# Patient Record
Sex: Male | Born: 1975 | Race: White | Hispanic: No | Marital: Married | Smoking: Never smoker
Health system: Southern US, Community
[De-identification: ages and names within clinical notes are randomized; demographics above are authoritative.]

## PROBLEM LIST (undated history)

## (undated) DIAGNOSIS — B019 Varicella without complication: Secondary | ICD-10-CM

## (undated) DIAGNOSIS — K219 Gastro-esophageal reflux disease without esophagitis: Secondary | ICD-10-CM

## (undated) DIAGNOSIS — T7840XA Allergy, unspecified, initial encounter: Secondary | ICD-10-CM

## (undated) DIAGNOSIS — E785 Hyperlipidemia, unspecified: Secondary | ICD-10-CM

## (undated) DIAGNOSIS — I10 Essential (primary) hypertension: Secondary | ICD-10-CM

## (undated) DIAGNOSIS — M109 Gout, unspecified: Secondary | ICD-10-CM

## (undated) HISTORY — DX: Varicella without complication: B01.9

## (undated) HISTORY — DX: Allergy, unspecified, initial encounter: T78.40XA

## (undated) HISTORY — DX: Hyperlipidemia, unspecified: E78.5

## (undated) HISTORY — DX: Gastro-esophageal reflux disease without esophagitis: K21.9

---

## 2012-01-02 ENCOUNTER — Emergency Department (HOSPITAL_BASED_OUTPATIENT_CLINIC_OR_DEPARTMENT_OTHER)
Admission: EM | Admit: 2012-01-02 | Discharge: 2012-01-02 | Disposition: A | Discharge status: Home / Self Care | Payer: 59 | Attending: Emergency Medicine | Admitting: Emergency Medicine

## 2012-01-02 ENCOUNTER — Emergency Department (HOSPITAL_BASED_OUTPATIENT_CLINIC_OR_DEPARTMENT_OTHER): Payer: 59

## 2012-01-02 ENCOUNTER — Encounter (HOSPITAL_BASED_OUTPATIENT_CLINIC_OR_DEPARTMENT_OTHER): Payer: Self-pay | Admitting: *Deleted

## 2012-01-02 DIAGNOSIS — R0789 Other chest pain: Secondary | ICD-10-CM

## 2012-01-02 DIAGNOSIS — I10 Essential (primary) hypertension: Secondary | ICD-10-CM | POA: Insufficient documentation

## 2012-01-02 DIAGNOSIS — R079 Chest pain, unspecified: Secondary | ICD-10-CM | POA: Insufficient documentation

## 2012-01-02 DIAGNOSIS — Z79899 Other long term (current) drug therapy: Secondary | ICD-10-CM | POA: Insufficient documentation

## 2012-01-02 DIAGNOSIS — R209 Unspecified disturbances of skin sensation: Secondary | ICD-10-CM | POA: Insufficient documentation

## 2012-01-02 HISTORY — DX: Essential (primary) hypertension: I10

## 2012-01-02 LAB — TROPONIN I
Troponin I: <0.3 ng/mL (ref <0.30)
Troponin I: <0.3 ng/mL (ref <0.30)

## 2012-01-02 NOTE — ED Provider Notes (Addendum)
History     CSN: [id number]  Arrival date & time 01/02/12  1308   First MD Initiated Contact with Patient 01/02/12 0800      Chief Complaint  Patient presents with  . Chest Pain    (Consider location/radiation/quality/duration/timing/severity/associated sxs/prior treatment) HPI Comments: Patient reports at 7:30 this morning he is walking outside to his car when he developed some tingling in a numb sensation involving the left side of his chest, left shoulder and into his left armpit and down the upper portion of his left arm. He reports his left arm continues to feel "heavy" but is not weak. He denies any numbness or weakness elsewhere in his body, no speech difficulty no headache. He denies any prior stroke or heart issues. He does take lisinopril for blood pressure. He notes that his grandfather had bypass cardiac surgery but no heart condition in his immediate family members. Patient does not smoke. He reports that [place] and a half ago without any exertional shortness of breath or chest pain. He reports with today's episode, which lasted approximately 5 minutes with nearly complete resolution, he did not have any shortness of breath, sweats, nausea or vomiting. In addition, he denies any fever, chills, URI symptoms. He denies any long distance travel, lower leg extremity swelling or pain and no pleuritic pain. He had not yet eaten anything significant this morning.  Patient is a 36 y.o. male presenting with chest pain. The history is provided by the patient.  Chest Pain Pertinent negatives for primary symptoms include no fever, no shortness of breath, no cough, no abdominal pain, no nausea and no vomiting.     Past Medical History  Diagnosis Date  . Hypertension     History reviewed. No pertinent past surgical history.  History reviewed. No pertinent family history.  History  Substance Use Topics  . Smoking status: Never Smoker   .  Smokeless tobacco: Not on file  . Alcohol Use: Yes      Review of Systems  Constitutional: Negative for fever and chills.  HENT: Negative for congestion and sore throat.   Respiratory: Negative for cough and shortness of breath.   Cardiovascular: Positive for chest pain.  Gastrointestinal: Negative for nausea, vomiting, abdominal pain and diarrhea.  Musculoskeletal: Negative for back pain.  All other systems reviewed and are negative.    Allergies  Review of patient's allergies indicates no known allergies.  Home Medications   Current Outpatient Rx  Name Route Sig Dispense Refill  . OMEGA-3 FATTY ACIDS 1000 MG PO CAPS Oral Take 2 g by mouth daily.    Marland Kitchen LISINOPRIL-HYDROCHLOROTHIAZIDE PO Oral Take by mouth.    Marland Kitchen VITAMIN D MAINTENANCE PO Oral Take by mouth.      BP 150/85  Pulse 68  Temp 97.8 F (36.6 C) (Oral)  Resp 18  SpO2 97%  Physical Exam  Nursing note and vitals reviewed. Constitutional: He is oriented to person, place, and time. He appears well-developed and well-nourished.  HENT:  Head: Normocephalic and atraumatic.  Eyes: Pupils are equal, round, and reactive to light.  Neck: Normal range of motion.  Cardiovascular: Normal rate and regular rhythm.   Pulmonary/Chest: Effort normal and breath sounds normal. No respiratory distress.  Abdominal: Soft. He exhibits no distension. There is no tenderness.  Musculoskeletal: Normal range of motion. He exhibits no edema and no tenderness.  Neurological: He is alert and oriented to person, place, and time. He has  normal strength. No cranial nerve deficit or sensory deficit. GCS eye subscore is 4. GCS verbal subscore is 5. GCS motor subscore is 6.  Skin: Skin is warm and dry.    ED Course  Procedures (including critical care time)   Labs Reviewed  TROPONIN I  TROPONIN I   Dg Chest 2 View  01/02/2012  *RADIOLOGY REPORT*  Clinical Data: Left arm numbness and tingling.  CHEST - 2 VIEW  Comparison: None.  Findings:  Normal sized heart.  Clear lungs with normal vascularity. Mild central peribronchial thickening.  Unremarkable bones.  IMPRESSION: Mild chronic bronchitic changes.  No acute abnormality.  Original Report Authenticated By: Darrol Angel, M.D.   I reviewed above film myself and I interpret to be normal.  1. Chest pain, atypical     RA sat is normal by my interpretation ECG at time 0806 shows NSR< normal ECG, no ST or T wave abn's.     11:09 AM Feels comfortable, no recurrence of severe pain.  No SOB, BP is improved.  Serial troponins are normal.  ECG normal, pt safe to follow up with PCP.  MDM  Pt with atypical CP, nearly resolved, now, no ECG changes.  Pt is reassured.  Will get delta troponin by lab.  Pt can follow up with PCP at [facility] in [facility] this week.          Gavin Pound. Oletta Lamas, MD 01/02/12 1109  Gavin Pound. Oletta Lamas, MD 01/02/12 1109  Gavin Pound. Marenda Accardi, MD 01/02/12 1119

## 2012-01-02 NOTE — ED Notes (Addendum)
Patient states around 730 this morning he experienced numbness/tingling on L side of chest when walking that went down his L arm, lasted around 5 minutes, L arm still feels weak, no SOB, no pain at this time

## 2012-01-02 NOTE — Discharge Instructions (Signed)
 Chest Pain (Nonspecific) It is often hard to give a specific diagnosis for the cause of chest pain. There is always a chance that your pain could be related to something serious, such as a heart attack or a blood clot in the lungs. You need to follow up with your caregiver for further evaluation. CAUSES   Heartburn.   Pneumonia or bronchitis.   Anxiety or stress.   Inflammation around your heart (pericarditis) or lung (pleuritis or pleurisy).   A blood clot in the lung.   A collapsed lung (pneumothorax). It can develop suddenly on its own (spontaneous pneumothorax) or from injury (trauma) to the chest.   Shingles infection (herpes zoster virus).  The chest wall is composed of bones, muscles, and cartilage. Any of these can be the source of the pain.  The bones can be bruised by injury.   The muscles or cartilage can be strained by coughing or overwork.   The cartilage can be affected by inflammation and become sore (costochondritis).  DIAGNOSIS  Lab tests or other studies, such as X-rays, electrocardiography, stress testing, or cardiac imaging, may be needed to find the cause of your pain.  TREATMENT   Treatment depends on what may be causing your chest pain. Treatment may include:   Acid blockers for heartburn.   Anti-inflammatory medicine.   Pain medicine for inflammatory conditions.   Antibiotics if an infection is present.   You may be advised to change lifestyle habits. This includes stopping smoking and avoiding alcohol, caffeine, and chocolate.   You may be advised to keep your head raised (elevated) when sleeping. This reduces the chance of acid going backward from your stomach into your esophagus.   Most of the time, nonspecific chest pain will improve within 2 to 3 days with rest and mild pain medicine.  HOME CARE INSTRUCTIONS   If antibiotics were prescribed, take your antibiotics as directed. Finish them even if you start to feel better.   For the next few  days, avoid physical activities that bring on chest pain. Continue physical activities as directed.   Do not smoke.   Avoid drinking alcohol.   Only take over-the-counter or prescription medicine for pain, discomfort, or fever as directed by your caregiver.   Follow your caregiver's suggestions for further testing if your chest pain does not go away.   Keep any follow-up appointments you made. If you do not go to an appointment, you could develop lasting (chronic) problems with pain. If there is any problem keeping an appointment, you must call to reschedule.  SEEK MEDICAL CARE IF:   You think you are having problems from the medicine you are taking. Read your medicine instructions carefully.   Your chest pain does not go away, even after treatment.   You develop a rash with blisters on your chest.  SEEK IMMEDIATE MEDICAL CARE IF:   You have increased chest pain or pain that spreads to your arm, neck, jaw, back, or abdomen.   You develop shortness of breath, an increasing cough, or you are coughing up blood.   You have severe back or abdominal pain, feel nauseous, or vomit.   You develop severe weakness, fainting, or chills.   You have a fever.  THIS IS AN EMERGENCY. Do not wait to see if the pain will go away. Get medical help at once. Call your local emergency services (911 in [place] Do not drive yourself to the hospital. MAKE SURE YOU:   Understand these instructions.  Will watch your condition.   Will get help right away if you are not doing well or get worse.  Document Released: 03/22/2005 Document Revised: 06/01/2011 Document Reviewed: 01/16/2008 [facility] Patient Information 2012 [place], [place].

## 2013-01-15 ENCOUNTER — Encounter (HOSPITAL_COMMUNITY): Payer: Self-pay | Admitting: *Deleted

## 2013-01-15 ENCOUNTER — Emergency Department (HOSPITAL_COMMUNITY)
Admission: EM | Admit: 2013-01-15 | Discharge: 2013-01-15 | Disposition: A | Discharge status: Home / Self Care | Payer: 59 | Attending: Emergency Medicine | Admitting: Emergency Medicine

## 2013-01-15 DIAGNOSIS — R51 Headache: Secondary | ICD-10-CM | POA: Insufficient documentation

## 2013-01-15 DIAGNOSIS — R110 Nausea: Secondary | ICD-10-CM | POA: Insufficient documentation

## 2013-01-15 DIAGNOSIS — H538 Other visual disturbances: Secondary | ICD-10-CM | POA: Insufficient documentation

## 2013-01-15 DIAGNOSIS — I10 Essential (primary) hypertension: Secondary | ICD-10-CM | POA: Insufficient documentation

## 2013-01-15 DIAGNOSIS — R42 Dizziness and giddiness: Secondary | ICD-10-CM

## 2013-01-15 DIAGNOSIS — Z79899 Other long term (current) drug therapy: Secondary | ICD-10-CM | POA: Insufficient documentation

## 2013-01-15 MED ORDER — MECLIZINE HCL 25 MG PO TABS
25.0000 mg | ORAL_TABLET | Freq: Once | ORAL | Status: AC
  Administered 2013-01-15: 25 mg via ORAL
  Filled 2013-01-15: qty 1

## 2013-01-15 MED ORDER — DIAZEPAM 5 MG PO TABS: 5.0000 mg | ORAL_TABLET | Freq: Three times a day (TID) | ORAL | Status: DC | PRN

## 2013-01-15 MED ORDER — MECLIZINE HCL 50 MG PO TABS: 50.0000 mg | ORAL_TABLET | Freq: Two times a day (BID) | ORAL | Status: DC | PRN

## 2013-01-15 MED ORDER — SODIUM CHLORIDE 0.9 % IV BOLUS (SEPSIS)
1000.0000 mL | Freq: Once | INTRAVENOUS | Status: AC
  Administered 2013-01-15: 1000 mL via INTRAVENOUS

## 2013-01-15 NOTE — ED Provider Notes (Signed)
History    CSN: [id number] Arrival date & time 01/15/13  1111  First MD Initiated Contact with Patient 01/15/13 1228     Chief Complaint  Patient presents with  . Dizziness   (Consider location/radiation/quality/duration/timing/severity/associated sxs/prior Treatment) HPI Pt is a 37yo male presenting today with dizziness that started earlier this morning.  Pt reports recent sinus pressure over the last week and notice an episode of dizziness last week that lasted a few seconds.  Pt also reports today was the first day he started his lower dose of BP medication.  He use to take Lisinopril with HCTZ, but today he started just Lisinopril as prescribed by provided at the [place].  Today pt states he was sitting down for an hour at work, when he stood up to write on a board he was doing okay until the room suddenly started to spin.   Pt states he feels off balance and cannot focus on one object, as if his eyes are looking in different directions.  When he closes his eyes his symptoms resolve.  Also reports mild nausea when his eyes are open.  Denies headache, numbness, tingling, vomiting.  Denies fevers, chills, cough, chest pain or SOB.     Past Medical History  Diagnosis Date  . Hypertension    History reviewed. No pertinent past surgical history. No family history on file. History  Substance Use Topics  . Smoking status: Never Smoker   . Smokeless tobacco: Not on file  . Alcohol Use: Yes    Review of Systems  Constitutional: Negative for fever, chills and fatigue.  Eyes: Positive for visual disturbance. Negative for photophobia, pain and redness.  Respiratory: Negative for shortness of breath.   Cardiovascular: Negative for chest pain.  Gastrointestinal: Positive for nausea. Negative for vomiting and diarrhea.  Neurological: Positive for dizziness ( "room spinning") and headaches ( mild). Negative for facial asymmetry, speech difficulty, weakness, light-headedness and numbness.  All  other systems reviewed and are negative.    Allergies  Review of patient's allergies indicates no known allergies.  Home Medications   Current Outpatient Rx  Name  Route  Sig  Dispense  Refill  . lisinopril (PRINIVIL,ZESTRIL) 10 MG tablet   Oral   Take 5 mg by mouth daily.         . diazepam (VALIUM) 5 MG tablet   Oral   Take 1 tablet (5 mg total) by mouth every 8 (eight) hours as needed.   6 tablet   0   . meclizine (ANTIVERT) 50 MG tablet   Oral   Take 1 tablet (50 mg total) by mouth 2 (two) times daily as needed for dizziness or nausea.   30 tablet   0    BP 149/90  Pulse 63  Temp(Src) 97.9 F (36.6 C) (Oral)  Resp 20  SpO2 97% Physical Exam  Nursing note and vitals reviewed. Constitutional: He is oriented to person, place, and time. He appears well-developed and well-nourished.  Pt sitting in exam bed, eyes closed.   HENT:  Head: Normocephalic and atraumatic.  Right Ear: Hearing, external ear and ear canal normal. Tympanic membrane is bulging. Tympanic membrane is not erythematous.  Left Ear: Hearing, external ear and ear canal normal. Tympanic membrane is bulging. Tympanic membrane is not erythematous.  Nose: Nose normal.  Mouth/Throat: Uvula is midline, oropharynx is clear and moist and mucous membranes are normal. No oropharyngeal exudate, posterior oropharyngeal edema, posterior oropharyngeal erythema or tonsillar abscesses.  Eyes: Conjunctivae and  EOM are normal. Pupils are equal, round, and reactive to light. Right eye exhibits no discharge. Left eye exhibits no discharge. No scleral icterus.  Neck: Normal range of motion. Neck supple.  Cardiovascular: Normal rate, regular rhythm and normal heart sounds.   Pulmonary/Chest: Effort normal and breath sounds normal. No respiratory distress. He has no wheezes. He has no rales. He exhibits no tenderness.  Abdominal: Soft. Bowel sounds are normal. He exhibits no distension and no mass. There is no tenderness.  There is no rebound and no guarding.  Musculoskeletal: Normal range of motion.  Neurological: He is alert and oriented to person, place, and time. He has normal strength. No cranial nerve deficit or sensory deficit. Coordination normal. GCS eye subscore is 4. GCS verbal subscore is 5. GCS motor subscore is 6.  CN II-XII in tact, no focal deficit, nl finger to nose coordination. Nl sensation, 5/5 strength in all major muscle groups. Neg romberg and nl gait.    Skin: Skin is warm and dry.    ED Course  Procedures (including critical care time) Labs Reviewed - No data to display No results found. 1. Vertigo     MDM  Pt presenting with classic vertigo.  Will tx with meclizine and fluids.  If not improved, will try ativan.  6:09 AM Pt states that he has moderate relief from fluids and meclizine. Would like to go home and have prescription medication for symptoms.  [facility] he can [facility] if needed.    Rx: antivert and valium.  Will discharge pt home and [facility] for [facility] as well as ongoing blood pressure monitoring and management. Return precautions given. Pt verbalized understanding and agreement with tx plan. Vitals: unremarkable. Discharged in stable condition.    Discussed pt with attending during ED encounter.   Junius Finner, PA-C 01/17/13 [phone]

## 2013-01-15 NOTE — ED Notes (Signed)
PT endorses dizziness starting today. intermittent "room spinning". Each time Sx return they are worse. PT reports upper sinus congestion recently. No known fever.

## 2013-01-15 NOTE — ED Notes (Signed)
Pt has had recent sinus pressure.  PT states today was sitting down for one hour and stood up to write on board and was doing fine until all the sudden the room started spinning, his balance was off, and states he had to shut his eyes.  Pt states at one point he could not even focus but that is better now.  Pt denies pain.  No extremity deficits.  Pt appears to have nystagmus when moving his eyes to the left and right.

## 2013-01-19 NOTE — ED Provider Notes (Signed)
Medical screening examination/treatment/procedure(s) were performed by non-physician practitioner and as supervising physician I was immediately available for consultation/collaboration.  Xaria Judon, MD 01/19/13 1016 

## 2013-06-26 HISTORY — PX: WISDOM TOOTH EXTRACTION: SHX21

## 2017-01-21 ENCOUNTER — Encounter (HOSPITAL_BASED_OUTPATIENT_CLINIC_OR_DEPARTMENT_OTHER): Payer: Self-pay | Admitting: *Deleted

## 2017-01-21 ENCOUNTER — Emergency Department (HOSPITAL_BASED_OUTPATIENT_CLINIC_OR_DEPARTMENT_OTHER)
Admission: EM | Admit: 2017-01-21 | Discharge: 2017-01-21 | Disposition: A | Discharge status: Home / Self Care | Payer: Non-veteran care | Attending: Physician Assistant | Admitting: Physician Assistant

## 2017-01-21 DIAGNOSIS — R51 Headache: Secondary | ICD-10-CM | POA: Diagnosis not present

## 2017-01-21 DIAGNOSIS — R6884 Jaw pain: Secondary | ICD-10-CM | POA: Insufficient documentation

## 2017-01-21 DIAGNOSIS — Z79899 Other long term (current) drug therapy: Secondary | ICD-10-CM | POA: Insufficient documentation

## 2017-01-21 DIAGNOSIS — I10 Essential (primary) hypertension: Secondary | ICD-10-CM | POA: Insufficient documentation

## 2017-01-21 DIAGNOSIS — R519 Headache, unspecified: Secondary | ICD-10-CM

## 2017-01-21 DIAGNOSIS — R220 Localized swelling, mass and lump, head: Secondary | ICD-10-CM | POA: Diagnosis present

## 2017-01-21 HISTORY — DX: Gout, unspecified: M10.9

## 2017-01-21 MED ORDER — CHLORHEXIDINE GLUCONATE 0.12 % MT SOLN: 15.0000 mL | Freq: Two times a day (BID) | OROMUCOSAL | 0 refills | Status: DC

## 2017-01-21 NOTE — ED Notes (Signed)
MD with pt  

## 2017-01-21 NOTE — ED Triage Notes (Signed)
Pt c/o of left ear pain since Wednesday. States on Thursday he had increased pain to the left side of his face/jaw/left ear. Was seen by his Dentist , had xrays and they did not think it was dental. Was prescribed Amoxicillin. States pain was worse later on Thursday so he was seen at urgent care and was given prednisone but did not fill. Has been taking Garlic pills since pain started. Has also taken aleve without relief. Came to be seen today because of concern of white patch to left inner cheek area and swelling to his left jaw area. On exam swelling noted to his left jaw. Denies any fevers.

## 2017-01-21 NOTE — ED Provider Notes (Signed)
[facility] Provider Note   CSN: [id number] Arrival date & time: 01/21/17  0240     History   Chief Complaint Chief Complaint  Patient presents with  . facial swelling    HPI Brian Rhodes is a 41 y.o. male.  HPI   Patient a 41 year old male presenting with swelling to the left jaw. Patient has had intermittent sharp pains to the left upper jaw Thursday. He went to his dentist had a Panorex which was normal. He then has been using garlic cloves crushed on the side of his mouth all day every day since then. Patient does not take antibiotics at the dentist prescribed. Patient increased pain on Thursday and went to a [place] and [place]. Was prescribed steroids. He also did not take these. He's coming now because he has a small amount of swelling to the left jaw.  Past Medical History:  Diagnosis Date  . Gout   . Hypertension     There are no active problems to display for this patient.   History reviewed. No pertinent surgical history.     Home Medications    Prior to Admission medications   Medication Sig Start Date End Date Taking? Authorizing Provider  ALLOPURINOL PO Take by mouth.   Yes [provider]  hydrALAZINE (APRESOLINE) 25 MG tablet Take 25 mg by mouth 2 (two) times daily.   Yes [provider]  chlorhexidine (PERIDEX) 0.12 % solution Use as directed 15 mLs in the mouth or throat 2 (two) times daily. 01/21/17   Mackuen, Courteney Lyn, MD  diazepam (VALIUM) 5 MG tablet Take 1 tablet (5 mg total) by mouth every 8 (eight) hours as needed. 01/15/13   Lurene ShadowPhelps, Erin O, PA-C  lisinopril (PRINIVIL,ZESTRIL) 10 MG tablet Take 5 mg by mouth daily.    [provider]  meclizine (ANTIVERT) 50 MG tablet Take 1 tablet (50 mg total) by mouth 2 (two) times daily as needed for dizziness or nausea. 01/15/13   Lurene ShadowPhelps, Erin O, PA-C    Family History No family history on file.  Social History Social History  Substance Use Topics  . Smoking  status: Never Smoker  . Smokeless tobacco: Never Used  . Alcohol use No     Allergies   Patient has no known allergies.   Review of Systems Review of Systems  Constitutional: Negative for fatigue and fever.  HENT: Positive for mouth sores.      Physical Exam Updated Vital Signs BP (!) 163/117 (BP Location: Right Arm)   Pulse 76   Temp 98.6 F (37 C) (Oral)   Ht 5\' 11"  (1.803 m)   Wt 116.6 kg (257 lb)   SpO2 98%   BMI 35.84 kg/m   Physical Exam  Constitutional: He is oriented to person, place, and time. He appears well-nourished.  HENT:  Head: Normocephalic.  White areas to the inside cheek and lower jaw. Looks reactive. It is in the same place that he's been stuffing the garlic. Pea-sized swelling on the lower mandible. Likely lymph node.  Eyes: Conjunctivae are normal.  Cardiovascular: Normal rate.   Pulmonary/Chest: Effort normal.  Neurological: He is oriented to person, place, and time.  Skin: Skin is warm and dry. He is not diaphoretic.  Psychiatric: He has a normal mood and affect. His behavior is normal.     ED Treatments / Results  Labs (all labs ordered are listed, but only abnormal results are displayed) Labs Reviewed - No data to display  EKG  EKG Interpretation None       Radiology No results found.  Procedures Procedures (including critical care time)  Medications Ordered in ED Medications - No data to display   Initial Impression / Assessment and Plan / ED Course  I have reviewed the triage vital signs and the nursing notes.  Pertinent labs & imaging results that were available during my care of the patient were reviewed by me and considered in my medical decision making (see chart for details).     Patient has been using garlic to treat occasional pains in his mouth. He has already ruled out of dental infection at the dentist but was prescribed antibiotic. He has been using garlic which I think is cause a local inflammation in his  mouth. We will have him stop using the garlic, use Peridex. We encouraged him to use antibiotics as prescribed and to follow up with his primary this week.  Final Clinical Impressions(s) / ED Diagnoses   Final diagnoses:  Pain of cheek    New Prescriptions New Prescriptions   CHLORHEXIDINE (PERIDEX) 0.12 % SOLUTION    Use as directed 15 mLs in the mouth or throat 2 (two) times daily.     Abelino DerrickMackuen, Courteney Lyn, MD 01/21/17 [phone]

## 2017-01-21 NOTE — Discharge Instructions (Signed)
We think you're likely having local inflammation due to the garlic that you've been putting in your cheek. Please use the mouthwash swish and spit. Used twice daily for 2 days. In addition to taking the antibiotics that you already have home. Please follow up wth a primary care.

## 2018-03-04 ENCOUNTER — Telehealth: Payer: Self-pay

## 2018-03-04 NOTE — Telephone Encounter (Signed)
Please advise 

## 2018-03-04 NOTE — Telephone Encounter (Signed)
Sure, I will see him, we can schedule

## 2018-03-04 NOTE — Telephone Encounter (Signed)
Called pt and scheduled appt for 07/18/18

## 2018-03-04 NOTE — Telephone Encounter (Signed)
Copied from CRM [id number]. Topic: Appointment Scheduling - Scheduling Inquiry for Clinic >> Mar 04, 2018  9:49 AM Jens Som A wrote: Reason for CRM: Pt called in and stated that he was recommend by a friend to see [facility] or [facility].  He would like to know if one of them would be willing to take him on has a new pt?    Best number  [phone]

## 2018-07-15 NOTE — Progress Notes (Addendum)
[facility] [address], [address] [place], [place] [phone] [phone]  Date:  07/18/2018   Name:  Brian JourneyDaniel Rhodes   DOB:  [birth year]   MRN:  [id number]  PCP:  Brian Rhodes    Chief Complaint: New Patient (Initial Visit)   History of Present Illness:  Brian JourneyDaniel Rhodes is a 43 y.o. very pleasant male patient who presents with the following:  New patient here today to establish care.  He is also a patient of the [facility] in [place], has been with the [facility] clinic for about 9 years History of hypertension and gout. He takes about 25 mg of allopurinol daily and this is sufficient to control his gout He also takes BOTH losartan and lisinopril-this was prescribed for him by the [facility] However we note that his blood pressure is still running high Patient recounts a history of possible depression symptoms when his HCTZ was increased from 12.5to 25 mg in the past.  However this is not entirely clear, could have been due to a different medication or circumstance Last labs in October per the [facility], he will try to get me a copy of these  He is married, works in Clinical biochemistcustomer service  Kids are 17,14 and 711 yo Oldest is early college/combination high school program.  He enjoys spending time with family and is active in his church He is part of the music program, plays the piano and keyboard  Both of his parents are Brian Rhodes They suffer from HTN, DM, hyperlipidemia No prostate cancer in the family No colon cancer in the family  His grandfather had CABG and we think his dad did too -both of these men had heart disease at a relatively early age  He is a non smoker, no tobacco Very rare alcohol For exercise he is working with a Psychologist, educationaltrainer; doing body weight exercises He is working on weight loss  He does not have any chest pain or shortness of breath, denies any chest pain with exercise, or decrease in exercise capacity  He notes a lump at the  base of his left index finger for a few years.  It only bothers him with direct pressure on the area.   He has noted some nervousness while driving recently- only on [address], smaller roads are ok He is still getting out on the highway,but does not enjoy it No accident occurred.  He does have PTSD which the the [facility] has treated.  He did some therapy that did help  He thinks it is driving issues are related to his PTSD, also wonders if his vision may be going downhill  He has not had an eye exam recently   He does have a [facility]ur [facility]-5 years [facility].  He is not sure what the outcome was   Current medications are lisinopril 10/hctz 12.5 and losartan 100  Amlodipine has caused chest pain in the past  He is very hesitant to take medications which "may cause side effects"  There are no active problems to display for this patient.   Past Medical History:  Diagnosis Date  . Allergy   . Chicken pox   . GERD (gastroesophageal reflux disease)   . Gout   . Hyperlipidemia   . Hypertension     Past Surgical History:  Procedure Laterality Date  . WISDOM TOOTH EXTRACTION  2015    Social History  Tobacco Use  . Smoking status: Never Smoker  . Smokeless tobacco: Former NeurosurgeonUser    Types: Chew  Substance Use Topics  . Alcohol use: Yes    Comment: occasionally  . Drug use: No    Family History  Problem Relation Age of Onset  . Diabetes Mother   . Hyperlipidemia Mother   . Hypertension Mother   . Miscarriages / [place] Mother   . Arthritis Father   . Diabetes Father   . Hyperlipidemia Father   . Hypertension Father   . Hypertension Sister   . Hypertension Brother   . Hypertension Brother   . Hypertension Brother     No Known Allergies  Medication list has been reviewed and updated.  Current Outpatient Medications on File Prior to Visit  Medication Sig Dispense Refill  . allopurinol (ZYLOPRIM) 100 MG tablet Take 100 mg by mouth daily. Taking  quarter of a tablet    . lisinopril-hydrochlorothiazide (PRINZIDE,ZESTORETIC) 10-12.5 MG tablet Take 1 tablet by mouth daily.    Marland Kitchen. losartan (COZAAR) 100 MG tablet Take 100 mg by mouth daily.     No current facility-administered medications on file prior to visit.     Review of Systems:  As per HPI- otherwise negative. His average home BP is 145/90 He is not sure about his tetanus shot- he thinks through the [facility], he thinks it is up-to-date He declines a flu shot today    Physical Examination: Vitals:   07/18/18 0942  BP: (!) 154/98  Pulse: 80  Resp: 16  Temp: 98.5 F (36.9 C)  SpO2: 97%   Vitals:   07/18/18 0942  Weight: 278 lb (126.1 kg)  Height: 5\' 11"  (1.803 m)   Body mass index is 38.77 kg/m. Ideal Body Weight: Weight in (lb) to have BMI = 25: 178.9  GEN: WDWN, NAD, Non-toxic, A & O x 3, obese, looks well  HEENT: Atraumatic, Normocephalic. Neck supple. No masses, No LAD. Bilateral TM wnl, oropharynx normal.  PEERL,EOMI.   Ears and Nose: No external deformity. CV: RRR, No G/R. No JVD. No thrill. No extra heart sounds. He has a loud, harsh systolic murmur PULM: CTA B, no wheezes, crackles, rhonchi. No retractions. No resp. distress. No accessory muscle use. ABD: S, NT, ND, +BS. No rebound. No HSM. EXTR: No c/c/e NEURO Normal gait.  PSYCH: Normally interactive. Conversant. Not depressed or anxious appearing.  Calm demeanor.  Likely cyst at the palmar aspect of the left second MCP joint.  This is the size of a large BB, it is mobile and smooth  EKG shows sinus rhythm, but possible old anterior infarct and nonspecific T wave abnormality.  I do not have any old tracing for comparison  Addnd; I did locate an EKG from 2014.  downgoing T waves in I are old, new downgoing T in V3.  Q in V2/3 are old Assessment and Plan: Physical exam  Heart murmur - Plan: EKG 12-Lead, ECHOCARDIOGRAM COMPLETE  Essential hypertension - Plan: Ambulatory referral to Cardiology, DISCONTINUED:  lisinopril (ZESTRIL) 10 MG tablet  Family history of early CAD - Plan: Ambulatory referral to Cardiology  Abnormal EKG - Plan: ECHOCARDIOGRAM COMPLETE, Ambulatory referral to Cardiology  Cyst of joint of left hand - Plan: Ambulatory referral to Hand Surgery   Here today as a new patient for physical exam.  He has had a recent routine labs per the [facility], and will get a copy to me. He has noted a cyst on his left hand, referral to  hand surgery for possible excision  He has a family history of heart disease, and we noted an abnormal EKG today.  He also has hypertension. In addition, he has a significant heart murmur.  This is not new, but has not been evaluated in several years. He is taking lisinopril 10/HCTZ 12.5, and also losartan 100.  Asked him to try doubling his lisinopril /HCTZ and stop losartan.  He will let me know how his BP looks on this regimen, will watch for any adverse side effects   Referral for an echocardiogram, and also refer to cardiology for further evaluation.  He may need a stress test of some type  Patient will keep me closely apprised as to his blood pressures Signed Abbe Amsterdam, Rhodes

## 2018-07-18 ENCOUNTER — Ambulatory Visit (INDEPENDENT_AMBULATORY_CARE_PROVIDER_SITE_OTHER): Payer: Managed Care, Other (non HMO) | Admitting: Family Medicine

## 2018-07-18 ENCOUNTER — Encounter: Payer: Self-pay | Admitting: Family Medicine

## 2018-07-18 VITALS — BP 154/98 | HR 80 | Temp 98.5°F | Resp 16 | Ht 71.0 in | Wt 278.0 lb

## 2018-07-18 DIAGNOSIS — Z0000 Encounter for general adult medical examination without abnormal findings: Secondary | ICD-10-CM | POA: Diagnosis not present

## 2018-07-18 DIAGNOSIS — R011 Cardiac murmur, unspecified: Secondary | ICD-10-CM

## 2018-07-18 DIAGNOSIS — Z8249 Family history of ischemic heart disease and other diseases of the circulatory system: Secondary | ICD-10-CM | POA: Diagnosis not present

## 2018-07-18 DIAGNOSIS — M25842 Other specified joint disorders, left hand: Secondary | ICD-10-CM

## 2018-07-18 DIAGNOSIS — I10 Essential (primary) hypertension: Secondary | ICD-10-CM

## 2018-07-18 DIAGNOSIS — R9431 Abnormal electrocardiogram [ECG] [EKG]: Secondary | ICD-10-CM

## 2018-07-18 MED ORDER — LISINOPRIL 10 MG PO TABS: 10.0000 mg | ORAL_TABLET | Freq: Every day | ORAL | 3 refills | Status: DC

## 2018-07-18 NOTE — Patient Instructions (Addendum)
Please bring me a copy of your most recent labs- CBC, metabolic profile, cholesterol I would also like to get the date of your most recent tetanus shot if you can find it   Your BP is a bit too high - let's add on 10 mg of lisinopril to your regimen Please let me know how your BP responds to this addition Let me know if you would like a referral to hand surgery to look at the cyst on your finger We will set you up for additional cardiac testing- an echo to evaluate your murmur, and a stress to look at your coronaries  You might try encourage you to consider taking a defensive driving course- this might increase your confidence behind the wheel.   Also, please do get you eyes checked!    Health Maintenance, Male A healthy lifestyle and preventive care is important for your health and wellness. Ask your health care provider about what schedule of regular examinations is right for you. What should I know about weight and diet? Eat a Healthy Diet  Eat plenty of vegetables, fruits, whole grains, low-fat dairy products, and lean protein.  Do not eat a lot of foods high in solid fats, added sugars, or salt.  Maintain a Healthy Weight Regular exercise can help you achieve or maintain a healthy weight. You should:  Do at least 150 minutes of exercise each week. The exercise should increase your heart rate and make you sweat (moderate-intensity exercise).  Do strength-training exercises at least twice a week. Watch Your Levels of Cholesterol and Blood Lipids  Have your blood tested for lipids and cholesterol every 5 years starting at 44 years of age. If you are at high risk for heart disease, you should start having your blood tested when you are 44 years old. You may need to have your cholesterol levels checked more often if: ? Your lipid or cholesterol levels are high. ? You are older than 44 years of age. ? You are at high risk for heart disease. What should I know about cancer  screening? Many types of cancers can be detected early and may often be prevented. Lung Cancer  You should be screened every year for lung cancer if: ? You are a current smoker who has smoked for at least 30 years. ? You are a former smoker who has quit within the past 15 years.  Talk to your health care provider about your screening options, when you should start screening, and how often you should be screened. Colorectal Cancer  Routine colorectal cancer screening usually begins at 44 years of age and should be repeated every 5-10 years until you are 44 years old. You may need to be screened more often if early forms of precancerous polyps or small growths are found. Your health care provider may recommend screening at an earlier age if you have risk factors for colon cancer.  Your health care provider may recommend using home test kits to check for hidden blood in the stool.  A small camera at the end of a tube can be used to examine your colon (sigmoidoscopy or colonoscopy). This checks for the earliest forms of colorectal cancer. Prostate and Testicular Cancer  Depending on your age and overall health, your health care provider may do certain tests to screen for prostate and testicular cancer.  Talk to your health care provider about any symptoms or concerns you have about testicular or prostate cancer. Skin Cancer  Check your skin from  head to toe regularly.  Tell your health care provider about any new moles or changes in moles, especially if: ? There is a change in a mole's size, shape, or color. ? You have a mole that is larger than a pencil eraser.  Always use sunscreen. Apply sunscreen liberally and repeat throughout the day.  Protect yourself by wearing long sleeves, pants, a wide-brimmed hat, and sunglasses when outside. What should I know about heart disease, diabetes, and high blood pressure?  If you are 101-74 years of age, have your blood pressure checked every 3-5  years. If you are 60 years of age or older, have your blood pressure checked every year. You should have your blood pressure measured twice-once when you are at a hospital or clinic, and once when you are not at a hospital or clinic. Record the average of the two measurements. To check your blood pressure when you are not at a hospital or clinic, you can use: ? An automated blood pressure machine at a pharmacy. ? A home blood pressure monitor.  Talk to your health care provider about your target blood pressure.  If you are between 39-51 years old, ask your health care provider if you should take aspirin to prevent heart disease.  Have regular diabetes screenings by checking your fasting blood sugar level. ? If you are at a normal weight and have a low risk for diabetes, have this test once every three years after the age of 82. ? If you are overweight and have a high risk for diabetes, consider being tested at a younger age or more often.  A one-time screening for abdominal aortic aneurysm (AAA) by ultrasound is recommended for men aged 65-75 years who are current or former smokers. What should I know about preventing infection? Hepatitis B If you have a higher risk for hepatitis B, you should be screened for this virus. Talk with your health care provider to find out if you are at risk for hepatitis B infection. Hepatitis C Blood testing is recommended for:  Everyone born from [birth year] through 1966.  Anyone with known risk factors for hepatitis C. Sexually Transmitted Diseases (STDs)  You should be screened each year for STDs including gonorrhea and chlamydia if: ? You are sexually active and are younger than 44 years of age. ? You are older than 44 years of age and your health care provider tells you that you are at risk for this type of infection. ? Your sexual activity has changed since you were last screened and you are at an increased risk for chlamydia or gonorrhea. Ask your health care  provider if you are at risk.  Talk with your health care provider about whether you are at high risk of being infected with HIV. Your health care provider may recommend a prescription medicine to help prevent HIV infection. What else can I do?  Schedule regular health, dental, and eye exams.  Stay current with your vaccines (immunizations).  Do not use any tobacco products, such as cigarettes, chewing tobacco, and e-cigarettes. If you need help quitting, ask your health care provider.  Limit alcohol intake to no more than 2 drinks per day. One drink equals 12 ounces of beer, 5 ounces of wine, or 1 ounces of hard liquor.  Do not use street drugs.  Do not share needles.  Ask your health care provider for help if you need support or information about quitting drugs.  Tell your health care provider if you often  feel depressed.  Tell your health care provider if you have ever been abused or do not feel safe at home. This information is not intended to replace advice given to you by your health care provider. Make sure you discuss any questions you have with your health care provider. Document Released: 12/09/2007 Document Revised: 02/09/2016 Document Reviewed: 03/16/2015 Elsevier Interactive Patient Education  2019 [employer]

## 2018-07-24 ENCOUNTER — Other Ambulatory Visit (HOSPITAL_COMMUNITY): Payer: No Typology Code available for payment source

## 2018-08-01 ENCOUNTER — Encounter: Payer: Self-pay | Admitting: Family Medicine

## 2018-08-01 LAB — HEPATIC FUNCTION PANEL
ALT: 85 — AB (ref 10–40)
AST: 35 (ref 14–40)
BILIRUBIN, TOTAL: 0.6
Bilirubin, Direct: 0.1 (ref 0.01–0.4)

## 2018-08-01 LAB — BASIC METABOLIC PANEL
Glucose: 96
Potassium: 3.2 — AB (ref 3.4–5.3)
SODIUM: 141 (ref 137–147)

## 2018-08-01 LAB — LIPID PANEL
Cholesterol: 190 (ref 0–200)
HDL: 27 — AB (ref 35–70)
LDL CALC: 101
TRIGLYCERIDES: 311 — AB (ref 40–160)

## 2018-08-08 ENCOUNTER — Encounter: Payer: Self-pay | Admitting: Family Medicine

## 2018-08-19 ENCOUNTER — Ambulatory Visit (HOSPITAL_COMMUNITY): Payer: 59 | Attending: Cardiology

## 2018-08-19 ENCOUNTER — Encounter: Payer: Self-pay | Admitting: Family Medicine

## 2018-08-19 DIAGNOSIS — R011 Cardiac murmur, unspecified: Secondary | ICD-10-CM | POA: Diagnosis present

## 2018-08-19 DIAGNOSIS — R9431 Abnormal electrocardiogram [ECG] [EKG]: Secondary | ICD-10-CM

## 2018-08-19 NOTE — Progress Notes (Signed)
Cardiology Office Note:    Date:  08/21/2018   ID:  Brian Rhodes, DOB 14-May-1976, MRN [id number]  PCP:  Pearline Cables, MD  Cardiologist:  Norman Herrlich, MD   Referring MD: Pearline Cables, MD  ASSESSMENT:    1. Left ventricular hypertrophy   2. Hypertensive heart disease without heart failure   3. Chronic gout without tophus, unspecified cause, unspecified site   4. Abnormal EKG   5. Hypokalemia    PLAN:    In order of problems listed above:  1. Historically he has longstanding hypertension not controlled and exhibiting endorgan damage with left ventricular hypertrophy.  I think the probability of this is overwhelming and the likelihood of unrecognized hypertrophic cardiomyopathy is quite low but is nonzero.  We discussed further evaluation with a cardiac MRI.  At this time I think it is reasonable for him to decline as he has no high risk markers.  I asked him to give follow-up to this.  I think the predominant importance here is to get his blood pressure controlled and to streamline therapy I asked him take a single ARB diuretic combination using a high potency ARB and placed him on a selective beta-blocker by systolic with his hypertrophy.  He will continue to monitor blood pressure at home.  He will see me in the office in 6 months recheck an EKG and revisit the issue of cardiac MRI or follow-up echocardiogram. 2. Poorly controlled see above encouraged him to follow sodium restriction weight loss exercise and to pursue a plant-based diet thinking remarkable effects 3. Stable 4. EKG changes consistent with left ventricular hypertrophy and/or hypokalemia 5. Untreated start potassium 20 mEq/day and follow-up at the [facility] hospital with his lab work  Next appointment   Medication Adjustments/Labs and Tests Ordered: Current medicines are reviewed at length with the patient today.  Concerns regarding medicines are outlined above.  No orders of the defined types were placed in this  encounter.  No orders of the defined types were placed in this encounter.    Chief Complaint  Patient presents with  . New Patient (Initial Visit)  . Hypertension    and abnormal Echo  . Abnormal ECG    History of Present Illness:    Brian Rhodes is a 43 y.o. male who is being seen today for the evaluation of hypertension with abnormal Echo at the request of Copland, Gwenlyn Found, MD. Echocardiogram 08/19/18 is normal.  He has a strong family history of hypertension with endorgan damage.  Sister of his has hypertensive chronic kidney disease.  He relates his first told his blood pressure elevated in the 1990s but was started on treatment in 2009 and despite taking 3 agents his average remains in the range of 145/95.  EKG in 2013 was normal 2014 at T wave inversions and is now more prominent and appears to be due to left ventricular hypertrophy.  He has no family history of sudden cardiac death or cardiomyopathy.  He has not had syncope chest pain shortness of breath and he has some mild palpitation not severe) or sustained.  Interestingly he has hypokalemia taking both an ACE ARB and thiazide diuretic.  I reviewed his EKG and echocardiogram findings with him and I told him that he has an overlap group between left ventricular hypertrophy from hypertension versus cardiomyopathy we discussed the merits of further evaluation with cardiac MRI at this time he prefers to defer have his blood pressure controlled and see me in the office  in follow-up.  He exhibits no high risk markers and I think this is a reasonable approach.  He does sodium restrict he is attempting to lose weight he exercises and he is attempting to follow a plant-based diet.  His EKG shows T wave inversion and echo with moderate concentric LVH.  Echo 08/19/18:   IMPRESSIONS  1. The left ventricle has normal systolic function with an ejection fraction of 60-65%. The cavity size was normal. There is moderately increased left  ventricular wall thickness. Left ventricular diastolic Doppler parameters are consistent with impaired  relaxation.  2. The right ventricle has normal systolic function. The cavity was normal. There is no increase in right ventricular wall thickness.  3. The mitral valve is normal in structure.  4. The tricuspid valve is normal in structure.  5. The aortic valve is tricuspid Mild thickening of the aortic valve.  6. The pulmonic valve was normal in structure.  7. The aortic root is normal in size and structure.  8. Normal LV systolic function; moderate LVH more prominent at the basal septum; mild diastolic dysfunction; systolic anterior motion of MV but no significant LVOT gradient at rest; possible HOCM; suggest cardiac MRI to further assess. Past Medical History:  Diagnosis Date  . Allergy   . Chicken pox   . GERD (gastroesophageal reflux disease)   . Gout   . Hyperlipidemia   . Hypertension     Past Surgical History:  Procedure Laterality Date  . WISDOM TOOTH EXTRACTION  2015    Current Medications: Current Meds  Medication Sig  . allopurinol (ZYLOPRIM) 100 MG tablet Take 100 mg by mouth daily. Taking quarter of a tablet  . [DISCONTINUED] lisinopril-hydrochlorothiazide (PRINZIDE,ZESTORETIC) 20-12.5 MG tablet Take 1 tablet by mouth daily.   . [DISCONTINUED] losartan (COZAAR) 100 MG tablet Take 100 mg by mouth daily.     Allergies:   Patient has no known allergies.   Social History   Socioeconomic History  . Marital status: Married    Spouse name: Not on file  . Number of children: Not on file  . Years of education: Not on file  . Highest education level: Not on file  Occupational History  . Not on file  Social Needs  . Financial resource strain: Not on file  . Food insecurity:    Worry: Not on file    Inability: Not on file  . Transportation needs:    Medical: Not on file    Non-medical: Not on file  Tobacco Use  . Smoking status: Never Smoker  . Smokeless  tobacco: Former Neurosurgeon    Types: Chew  Substance and Sexual Activity  . Alcohol use: Yes    Comment: occasionally  . Drug use: No  . Sexual activity: Not on file  Lifestyle  . Physical activity:    Days per week: Not on file    Minutes per session: Not on file  . Stress: Not on file  Relationships  . Social connections:    Talks on phone: Not on file    Gets together: Not on file    Attends religious service: Not on file    Active member of club or organization: Not on file    Attends meetings of clubs or organizations: Not on file    Relationship status: Not on file  Other Topics Concern  . Not on file  Social History Narrative  . Not on file     Family History: The patient's family history includes Arthritis  in his father; Diabetes in his father and mother; Hyperlipidemia in his father and mother; Hypertension in his brother, brother, brother, father, mother, and sister; Miscarriages / [place] in his mother.  ROS:   Review of Systems  Constitution: Negative.  HENT: Negative.   Eyes: Negative.   Cardiovascular: Positive for palpitations.  Respiratory: Negative.   Endocrine: Negative.   Hematologic/Lymphatic: Negative.   Skin: Negative.   Musculoskeletal: Positive for joint pain.  Gastrointestinal: Negative.   Genitourinary: Negative.   Neurological: Negative.   Psychiatric/Behavioral: The patient is nervous/anxious.   Allergic/Immunologic: Negative.    Please see the history of present illness.     All other systems reviewed and are negative.  EKGs/Labs/Other Studies Reviewed:    The following studies were reviewed today: I independently reviewed his EKGs 2013 2014 and recent his PCP office  Recent Labs: 08/01/2018: ALT 85; Potassium 3.2; Sodium 141  Recent Lipid Panel    Component Value Date/Time   CHOL 190 08/01/2018   TRIG 311 (A) 08/01/2018   HDL 27 (A) 08/01/2018   LDLCALC 101 08/01/2018    Physical Exam:    VS:  BP (!) 148/90   Pulse 71   Ht  5\' 11"  (1.803 m)   Wt 283 lb (128.4 kg)   SpO2 98%   BMI 39.47 kg/m     Wt Readings from Last 3 Encounters:  08/21/18 283 lb (128.4 kg)  07/18/18 278 lb (126.1 kg)  01/21/17 257 lb (116.6 kg)     GEN:  Well nourished, well developed in no acute distress HEENT: Normal NECK: No JVD; No carotid bruits LYMPHATICS: No lymphadenopathy CARDIAC: He has a barely able to be heard grade 1/2 of 6 systolic ejection murmur left lower sternal border it is unchanged with squatting maneuvers RRR, no rubs, gallops RESPIRATORY:  Clear to auscultation without rales, wheezing or rhonchi  ABDOMEN: Soft, non-tender, non-distended MUSCULOSKELETAL:  No edema; No deformity  SKIN: Warm and dry NEUROLOGIC:  Alert and oriented x 3 PSYCHIATRIC:  Normal affect     Signed, Norman Herrlich, MD  08/21/2018 9:33 AM    [facility]

## 2018-08-21 ENCOUNTER — Encounter: Payer: Self-pay | Admitting: Cardiology

## 2018-08-21 ENCOUNTER — Ambulatory Visit (INDEPENDENT_AMBULATORY_CARE_PROVIDER_SITE_OTHER): Payer: 59 | Admitting: Cardiology

## 2018-08-21 VITALS — BP 148/90 | HR 71 | Ht 71.0 in | Wt 283.0 lb

## 2018-08-21 DIAGNOSIS — M109 Gout, unspecified: Secondary | ICD-10-CM | POA: Insufficient documentation

## 2018-08-21 DIAGNOSIS — M1A9XX0 Chronic gout, unspecified, without tophus (tophi): Secondary | ICD-10-CM

## 2018-08-21 DIAGNOSIS — R9431 Abnormal electrocardiogram [ECG] [EKG]: Secondary | ICD-10-CM | POA: Diagnosis not present

## 2018-08-21 DIAGNOSIS — I119 Hypertensive heart disease without heart failure: Secondary | ICD-10-CM

## 2018-08-21 DIAGNOSIS — I517 Cardiomegaly: Secondary | ICD-10-CM

## 2018-08-21 DIAGNOSIS — E876 Hypokalemia: Secondary | ICD-10-CM

## 2018-08-21 MED ORDER — TELMISARTAN-HCTZ 40-12.5 MG PO TABS: 1.0000 | ORAL_TABLET | Freq: Every day | ORAL | 6 refills | Status: DC

## 2018-08-21 MED ORDER — NEBIVOLOL HCL 10 MG PO TABS: 10.0000 mg | ORAL_TABLET | Freq: Every day | ORAL | 6 refills | Status: DC

## 2018-08-21 MED ORDER — POTASSIUM CHLORIDE CRYS ER 20 MEQ PO TBCR: 20.0000 meq | EXTENDED_RELEASE_TABLET | Freq: Every day | ORAL | 2 refills | Status: DC

## 2018-08-21 NOTE — Patient Instructions (Addendum)
Medication Instructions:  Your physician has recommended you make the following change in your medication:    STOP taking lisinopril/HCTZ STOP taking losartan START taking telmisartin 40/12.5 (1 tablet) daily START taking bystolic 10 mg (1 tablet) daily START taking potassium 20mg  (1 tablet) daily   If you need a refill on your cardiac medications before your next appointment, please call your pharmacy.   Lab work: NONE  Testing/Procedures: NONE  Follow-Up: At [facility], you and your health needs are our priority.  As part of our continuing mission to provide you with exceptional heart care, we have created designated Provider Care Teams.  These Care Teams include your primary Cardiologist (physician) and Advanced Practice Providers (APPs -  Physician Assistants and Nurse Practitioners) who all work together to provide you with the care you need, when you need it. You will need a follow up appointment in 6 months.  Please call our office 2 months in advance to schedule this appointment.    Any Other Special Instructions Will Be Listed Below       [facility]  Tips to measure your blood pressure correctly  To determine whether you have hypertension, a medical professional will take a blood pressure reading. How you prepare for the test, the position of your arm, and other factors can change a blood pressure reading by 10% or more. That could be enough to hide high blood pressure, start you on a drug you don't really need, or lead your doctor to incorrectly adjust your medications. National and international guidelines offer specific instructions for measuring blood pressure. If a doctor, nurse, or medical assistant isn't doing it right, don't hesitate to ask him or her to get with the guidelines. Here's what you can do to ensure a correct reading: . Don't drink a caffeinated beverage or smoke during the 30 minutes before the test. . Sit quietly for five minutes before the test  begins. . During the measurement, sit in a chair with your feet on the floor and your arm supported so your elbow is at about heart level. . The inflatable part of the cuff should completely cover at least 80% of your upper arm, and the cuff should be placed on bare skin, not over a shirt. . Don't talk during the measurement. . Have your blood pressure measured twice, with a brief break in between. If the readings are different by 5 points or more, have it done a third time. There are times to break these rules. If you sometimes feel lightheaded when getting out of bed in the morning or when you stand after sitting, you should have your blood pressure checked while seated and then while standing to see if it falls from one position to the next. Because blood pressure varies throughout the day, your doctor will rarely diagnose hypertension on the basis of a single reading. Instead, he or she will want to confirm the measurements on at least two occasions, usually within a few weeks of one another. The exception to this rule is if you have a blood pressure reading of 180/110 mm Hg or higher. A result this high usually calls for prompt treatment. It's also a good idea to have your blood pressure measured in both arms at least once, since the reading in one arm (usually the right) may be higher than that in the left. A 2014 study in The American Journal of Medicine of nearly 3,400 people found average arm- to-arm differences in systolic blood pressure of about  5 points. The higher number should be used to make treatment decisions. In 2017, new guidelines from the American Heart Association, the [employer], and nine other health organizations lowered the diagnosis of high blood pressure to 130/80 mm Hg or higher for all adults. The guidelines also redefined the various blood pressure categories to now include normal, elevated, Stage 1 hypertension, Stage 2 hypertension, and hypertensive crisis  (see "Blood pressure categories"). Blood pressure categories  Blood pressure category SYSTOLIC (upper number)  DIASTOLIC (lower number)  Normal Less than 120 mm Hg and Less than 80 mm Hg  Elevated 120-129 mm Hg and Less than 80 mm Hg  High blood pressure: Stage 1 hypertension 130-139 mm Hg or 80-89 mm Hg  High blood pressure: Stage 2 hypertension 140 mm Hg or higher or 90 mm Hg or higher  Hypertensive crisis (consult your doctor immediately) Higher than 180 mm Hg and/or Higher than 120 mm Hg  Source: American Heart Association and American Stroke Association. For more on getting your blood pressure under control, buy Controlling Your Blood Pressure, a Special Health Report from [employer] [employer] [employer].    Potassium chloride tablets, extended-release tablets or capsules What is this medicine? POTASSIUM CHLORIDE (poe TASS i um KLOOR ide) is a potassium supplement used to prevent and to treat low potassium. Potassium is important for the heart, muscles, and nerves. Too much or too little potassium in the body can cause serious problems. This medicine may be used for other purposes; ask your health care provider or pharmacist if you have questions. COMMON BRAND NAME(S): ED-K+10, K-10, K-8, K-Dur, K-Tab, Kaon-CL, Klor-Con, Klor-Con M10, Klor-Con M15, Klor-Con M20, Klotrix, Micro-K, Micro-K Extencaps, Slow-K What should I tell my health care provider before I take this medicine? They need to know if you have any of these conditions: -Addison's disease -dehydration -diabetes -difficulty swallowing -heart disease -high levels of potassium in the blood -irregular heartbeat -kidney disease -recent severe burn -stomach ulcers or other stomach problems -an unusual or allergic reaction to potassium, tartrazine, other medicines, foods, dyes, or preservatives -pregnant or trying to get pregnant -breast-feeding How should I use this medicine? Take this medicine by mouth with a full glass of water.  Take with food. Follow the directions on the prescription label. Do not suck on, crush, or chew this medicine. If you have difficulty swallowing, ask the pharmacist how to take. Take your medicine at regular intervals. Do not take it more often than directed. Do not stop taking except on your doctor's advice. Talk to your pediatrician regarding the use of this medicine in children. Special care may be needed. Overdosage: If you think you have taken too much of this medicine contact a poison control center or emergency room at once. NOTE: This medicine is only for you. Do not share this medicine with others. What if I miss a dose? If you miss a dose, take it as soon as you can. If it is almost time for your next dose, take only that dose. Do not take double or extra doses. What may interact with this medicine? Do not take this medicine with any of the following medications: -certain diuretics such as spironolactone, triamterene -certain medicines for stomach problems like atropine; difenoxin and glycopyrrolate -eplerenone -sodium polystyrene sulfonate This medicine may also interact with the following medications: -certain medicines for blood pressure or heart disease like lisinopril, losartan, quinapril, valsartan -medicines that lower your chance of fighting infection such as cyclosporine, tacrolimus -NSAIDs, medicines for pain  and inflammation, like ibuprofen or naproxen -other potassium supplements -salt substitutes This list may not describe all possible interactions. Give your health care provider a list of all the medicines, herbs, non-prescription drugs, or dietary supplements you use. Also tell them if you smoke, drink alcohol, or use illegal drugs. Some items may interact with your medicine. What should I watch for while using this medicine? Visit your doctor or health care professional for regular check ups. You will need lab work done regularly. You may need to be on a special diet  while taking this medicine. Ask your doctor. What side effects may I notice from receiving this medicine? Side effects that you should report to your doctor or health care professional as soon as possible: -allergic reactions like skin rash, itching or hives, swelling of the face, lips, or tongue -black, tarry stools -breathing problems -confusion -heartburn -fast, irregular heartbeat -feeling faint or lightheaded, falls -low blood pressure -numbness or tingling in hands or feet -pain when swallowing -unusually weak or tired -weakness, heaviness of legs Side effects that usually do not require medical attention (report to your doctor or health care professional if they continue or are bothersome): -diarrhea -nausea, vomiting -stomach pain This list may not describe all possible side effects. Call your doctor for medical advice about side effects. You may report side effects to FDA at 1-800-FDA-1088. Where should I keep my medicine? Keep out of the reach of children. Store at room temperature between 15 and 30 degrees C (59 and 86 degrees F ). Keep bottle closed tightly to protect this medicine from light and moisture. Throw away any unused medicine after the expiration date. NOTE: This sheet is a summary. It may not cover all possible information. If you have questions about this medicine, talk to your doctor, pharmacist, or health care provider.  2019 Elsevier/Gold Standard (2016-03-15 11:43:27) Nebivolol oral tablets What is this medicine? NEBIVOLOL (ne BIV oh lol) is a beta-blocker. Beta-blockers reduce the workload on the heart and help it to beat more regularly. This medicine is used to treat high blood pressure. This medicine may be used for other purposes; ask your health care provider or pharmacist if you have questions. COMMON BRAND NAME(S): Bystolic What should I tell my health care provider before I take this medicine? They need to know if you have any of these  conditions: -diabetes -heart or vessel disease like slow heartrate, worsening heart failure, heart block, sick sinus syndrome or Raynaud's disease -kidney disease -liver disease -lung disease like asthma or emphysema -pheochromocytoma -thyroid disease -an unusual or allergic reaction to nebivolol, other beta-blockers, medicines, foods, dyes, or preservatives -pregnant or trying to get pregnant -breast-feeding How should I use this medicine? Take this medicine by mouth with a glass of water. Follow the directions on the prescription label. You can take this medicine with or without food. Take your doses at regular intervals. Do not take your medicine more often than directed. Do not stop taking this medicine suddenly. This could lead to serious heart-related effects. Talk to your pediatrician regarding the use of this medicine in children. Special care may be needed. Overdosage: If you think you have taken too much of this medicine contact a poison control center or emergency room at once. NOTE: This medicine is only for you. Do not share this medicine with others. What if I miss a dose? If you miss a dose, take it as soon as you can. If it is almost time for your next dose, take  only that dose. Do not take double or extra doses. What may interact with this medicine? This medicine may interact with the following medications: -certain medicines for blood pressure, heart disease, irregular heart beat -certain medicines for depression, like fluoxetine or paroxetine -cimetidine -clonidine -reserpine -sildenafil This list may not describe all possible interactions. Give your health care provider a list of all the medicines, herbs, non-prescription drugs, or dietary supplements you use. Also tell them if you smoke, drink alcohol, or use illegal drugs. Some items may interact with your medicine. What should I watch for while using this medicine? Visit your doctor or health care professional for  regular checks on your progress. Check your heart rate and blood pressure regularly while you are taking this medicine. Ask your doctor or health care professional what your heart rate and blood pressure should be, and when you should contact him or her. You may get drowsy or dizzy. Do not drive, use machinery, or do anything that needs mental alertness until you know how this drug affects you. Do not stand or sit up quickly, especially if you are an older patient. This reduces the risk of dizzy or fainting spells. Alcohol can make you more drowsy and dizzy. Avoid alcoholic drinks. This medicine can affect blood sugar levels. If you have diabetes, check with your doctor or health care professional before you change your diet or the dose of your diabetic medicine. Do not treat yourself for coughs, colds, or pain while you are taking this medicine without asking your doctor or health care professional for advice. Some ingredients may increase your blood pressure. What side effects may I notice from receiving this medicine? Side effects that you should report to your doctor or health care professional as soon as possible: -allergic reactions like skin rash, itching or hives, swelling of the face, lips, or tongue -breathing problems -chest pain -cold, tingling, or numb hands or feet -dark urine -general ill feeling or flu-like symptoms -irregular heartbeat -light-colored stools -loss of appetite, nausea -right upper belly pain -slow heart rate -swollen legs or ankles -unusual bleeding or bruising -unusually weak or tired -vomiting -yellowing of the eyes or skin Side effects that usually do not require medical attention (report to your doctor or health care professional if they continue or are bothersome): -diarrhea -dizziness -dry or burning eyes -headache -nausea -tiredness -trouble sleeping This list may not describe all possible side effects. Call your doctor for medical advice about  side effects. You may report side effects to FDA at 1-800-FDA-1088. Where should I keep my medicine? Keep out of the reach of children. Store at room temperature between 20 and 25 degrees C (68 and 77 degrees F). Protect from light. Keep container tightly closed. Throw away any unused medicine after the expiration date. NOTE: This sheet is a summary. It may not cover all possible information. If you have questions about this medicine, talk to your doctor, pharmacist, or health care provider.  2019 Elsevier/Gold Standard (2013-02-14 14:46:00) Telmisartan tablets What is this medicine? TELMISARTAN (tel mi SAR tan) is used to treat high blood pressure. This medicine may be used for other purposes; ask your health care provider or pharmacist if you have questions. COMMON BRAND NAME(S): Micardis What should I tell my health care provider before I take this medicine? They need to know if you have any of these conditions: -if you are on a special diet, such as a low-salt diet -kidney or liver disease -an unusual or allergic reaction to telmisartan,  other medicines, foods, dyes, or preservatives -pregnant or trying to get pregnant -breast-feeding How should I use this medicine? Take this medicine by mouth with a glass of water. Follow the directions on the prescription label. This medicine can be taken with or without food. Take your doses at regular intervals. Do not take your medicine more often than directed. Talk to your pediatrician regarding the use of this medicine in children. Special care may be needed. Overdosage: If you think you have taken too much of this medicine contact a poison control center or emergency room at once. NOTE: This medicine is only for you. Do not share this medicine with others. What if I miss a dose? If you miss a dose, take it as soon as you can. If it is almost time for your next dose, take only that dose. Do not take double or extra doses. What may interact with  this medicine? -digoxin -potassium salts or potassium supplements -warfarin This list may not describe all possible interactions. Give your health care provider a list of all the medicines, herbs, non-prescription drugs, or dietary supplements you use. Also tell them if you smoke, drink alcohol, or use illegal drugs. Some items may interact with your medicine. What should I watch for while using this medicine? Visit your doctor or health care professional for regular checks on your progress. Check your blood pressure as directed. Ask your doctor or health care professional what your blood pressure should be and when you should contact him or her. Call your doctor or health care professional if you notice an irregular or fast heart beat. Women should inform their doctor if they wish to become pregnant or think they might be pregnant. There is a potential for serious side effects to an unborn child, particularly in the second or third trimester. Talk to your health care professional or pharmacist for more information. You may get drowsy or dizzy. Do not drive, use machinery, or do anything that needs mental alertness until you know how this drug affects you. Do not stand or sit up quickly, especially if you are an older patient. This reduces the risk of dizzy or fainting spells. Alcohol can make you more drowsy and dizzy. Avoid alcoholic drinks. Avoid salt substitutes unless you are told otherwise by your doctor or health care professional. Do not treat yourself for coughs, colds, or pain while you are taking this medicine without asking your doctor or health care professional for advice. Some ingredients may increase your blood pressure. What side effects may I notice from receiving this medicine? Side effects that you should report to your doctor or health care professional as soon as possible: -allergic reactions like skin rash, itching or hives, swelling of the face, lips, or tongue -breathing  problems -dark urine -gout pain -muscle pains -slow heartbeat -trouble passing urine or change in the amount of urine -unusual bleeding or bruising -yellowing of the eyes or skin Side effects that usually do not require medical attention (report to your doctor or health care professional if they continue or are bothersome): -back pain -change in sex drive or performance -diarrhea -sore throat or stuffy nose This list may not describe all possible side effects. Call your doctor for medical advice about side effects. You may report side effects to FDA at 1-800-FDA-1088. Where should I keep my medicine? Keep out of the reach of children. Store at room temperature between 15 and 30 degrees C (59 and 86 degrees F). Tablets should not be removed  from the blisters until right before use. Throw away any unused medicine after the expiration date. NOTE: This sheet is a summary. It may not cover all possible information. If you have questions about this medicine, talk to your doctor, pharmacist, or health care provider.  2019 Elsevier/Gold Standard (2007-08-28 13:39:10)

## 2018-09-05 ENCOUNTER — Other Ambulatory Visit: Payer: Self-pay

## 2018-09-05 MED ORDER — NEBIVOLOL HCL 10 MG PO TABS: 10.0000 mg | ORAL_TABLET | Freq: Every day | ORAL | 6 refills | Status: DC

## 2018-09-05 MED ORDER — TELMISARTAN-HCTZ 40-12.5 MG PO TABS: 1.0000 | ORAL_TABLET | Freq: Every day | ORAL | 6 refills | Status: DC

## 2018-09-05 NOTE — Telephone Encounter (Signed)
Patient request rx for bystolic and micardis and last office note be faxed to Dr. Joseph Art office for refills. Information sent by RN on 09-05-18.

## 2018-09-09 ENCOUNTER — Other Ambulatory Visit: Payer: Self-pay

## 2018-09-09 MED ORDER — NEBIVOLOL HCL 10 MG PO TABS: 10.0000 mg | ORAL_TABLET | Freq: Every day | ORAL | 1 refills | Status: DC

## 2018-09-09 MED ORDER — TELMISARTAN-HCTZ 40-12.5 MG PO TABS: 1.0000 | ORAL_TABLET | Freq: Every day | ORAL | 1 refills | Status: DC

## 2018-09-23 ENCOUNTER — Other Ambulatory Visit: Payer: Self-pay

## 2018-09-23 ENCOUNTER — Encounter: Payer: Self-pay | Admitting: *Deleted

## 2018-09-23 MED ORDER — TELMISARTAN-HCTZ 40-12.5 MG PO TABS: 1.0000 | ORAL_TABLET | Freq: Every day | ORAL | 3 refills | Status: DC

## 2018-10-30 ENCOUNTER — Ambulatory Visit (INDEPENDENT_AMBULATORY_CARE_PROVIDER_SITE_OTHER): Payer: 59 | Admitting: Family Medicine

## 2018-10-30 ENCOUNTER — Other Ambulatory Visit: Payer: Self-pay

## 2018-10-30 ENCOUNTER — Telehealth: Payer: Self-pay

## 2018-10-30 ENCOUNTER — Encounter: Payer: Self-pay | Admitting: Family Medicine

## 2018-10-30 DIAGNOSIS — M25561 Pain in right knee: Secondary | ICD-10-CM

## 2018-10-30 DIAGNOSIS — M25461 Effusion, right knee: Secondary | ICD-10-CM | POA: Diagnosis not present

## 2018-10-30 NOTE — Progress Notes (Signed)
[facility] [address], [address] [place], [place] [address] [phone]  Date:  10/30/2018   Name:  Brian Rhodes   DOB:  Apr 19, 1976   MRN:  [id number]  PCP:  Pearline Cables, MD    Chief Complaint: No chief complaint on file.   History of Present Illness:  Brian Rhodes is a 43 y.o. very pleasant male patient who presents with the following:  Pt location is home Provider location is home- he is in his home office Virtual visit today due to pandemic Pt ID confirmed with name and DOB- he gives consent for virtual visit today   I last saw him in the office for a physical in January History of hypertension, gout.  He takes allopurinol 25 mg daily for gout prophylaxis  He is also been getting some of his care at the [place], and some through the private sector  Had him see cardiology after last visit due to hypertension and murmur.  He was very pleased with the care he received  Visit today is regarding issue with his right knee He has noted RIGHT knee issues off and on, over the last 25- 30 years since his time in the [employer] The left knee is ok  On 3/6- he was kneeling and put his full weight on the knee; the next day his knee was swollen and inflamed He thought it was gout- he went to [facility] and got on prednisone.  He actually tried oral steroids twice, did not improve  He went to the [place] a couple of times, had an x-ray which showed "an effusion" However, his knee has not yet been examined by an orthopedist.  He had a virtual visit with 1 of the [place] doctors  It is hard to bear weight, he is actually having to use crutches He is not sure about popping or clicking as his range of motion is still limited The knee is swollen still- can be significant by the end of the day, up to twice normal size The knee is not hot, no fever He is taking meloxicam, but this does not really seem to be helping Never had knee surgery Has had plain  film, but no MRI  Patient Active Problem List   Diagnosis Date Noted  . Left ventricular hypertrophy 08/21/2018  . Hypertensive heart disease 08/21/2018  . Gout 08/21/2018  . Abnormal EKG 08/21/2018    Past Medical History:  Diagnosis Date  . Allergy   . Chicken pox   . GERD (gastroesophageal reflux disease)   . Gout   . Hyperlipidemia   . Hypertension     Past Surgical History:  Procedure Laterality Date  . WISDOM TOOTH EXTRACTION  2015    Social History   Tobacco Use  . Smoking status: Never Smoker  . Smokeless tobacco: Former Neurosurgeon    Types: Chew  Substance Use Topics  . Alcohol use: Yes    Comment: occasionally  . Drug use: No    Family History  Problem Relation Age of Onset  . Diabetes Mother   . Hyperlipidemia Mother   . Hypertension Mother   . Miscarriages / [place] Mother   . Arthritis Father   . Diabetes Father   . Hyperlipidemia Father   . Hypertension Father   . Hypertension Sister   . Hypertension Brother   . Hypertension Brother   . Hypertension Brother     No Known Allergies  Medication  list has been reviewed and updated.  Current Outpatient Medications on File Prior to Visit  Medication Sig Dispense Refill  . allopurinol (ZYLOPRIM) 100 MG tablet Take 100 mg by mouth daily. Taking quarter of a tablet    . nebivolol (BYSTOLIC) 10 MG tablet Take 1 tablet (10 mg total) by mouth daily. 90 tablet 1  . potassium chloride SA (KLOR-CON M20) 20 MEQ tablet Take 1 tablet (20 mEq total) by mouth daily. 90 tablet 2  . telmisartan-hydrochlorothiazide (MICARDIS HCT) 40-12.5 MG tablet Take 1 tablet by mouth daily. 90 tablet 3   No current facility-administered medications on file prior to visit.     Review of Systems:  No cough or fever  Physical Examination: There were no vitals filed for this visit. There were no vitals filed for this visit. There is no height or weight on file to calculate BMI. Ideal Body Weight:     His BP is running  130/89- this is an improvement over previous, cardiology change his blood pressure medication Viewed patient over video.  He looks well, overweight.  No cough, wheezing, tachypnea is noted  Assessment and Plan: Acute pain of right knee - Plan: Ambulatory referral to Orthopedic Surgery  Effusion of right knee - Plan: Ambulatory referral to Orthopedic Surgery  Brian Rhodes calls in today for concern of a new problem.  He has had knee issues chronically over the years, but about 2 months ago had an acute exacerbation.  He is now not able to bear weight, and notes significant swelling.  He needs referral to orthopedics, have placed this referral for him now.  We have discussed possible treatment options, possible MRI.  He will let me know if not able to see Ortho in the next week or so  Signed Abbe AmsterdamJessica , MD

## 2018-10-30 NOTE — Telephone Encounter (Signed)
Scheduled patient virtual visit to see pcp this afternoon at 3:40

## 2018-12-19 ENCOUNTER — Encounter: Payer: Self-pay | Admitting: Family Medicine

## 2018-12-23 ENCOUNTER — Encounter: Payer: Self-pay | Admitting: Family Medicine

## 2018-12-23 ENCOUNTER — Ambulatory Visit (INDEPENDENT_AMBULATORY_CARE_PROVIDER_SITE_OTHER): Payer: 59 | Admitting: Family Medicine

## 2018-12-23 ENCOUNTER — Other Ambulatory Visit: Payer: Self-pay

## 2018-12-23 ENCOUNTER — Ambulatory Visit (HOSPITAL_BASED_OUTPATIENT_CLINIC_OR_DEPARTMENT_OTHER)
Admission: RE | Admit: 2018-12-23 | Discharge: 2018-12-23 | Disposition: A | Discharge status: Home / Self Care | Payer: 59 | Source: Ambulatory Visit | Attending: Family Medicine | Admitting: Family Medicine

## 2018-12-23 VITALS — BP 148/95 | HR 62 | Temp 98.1°F | Resp 18 | Ht 71.0 in | Wt 286.0 lb

## 2018-12-23 DIAGNOSIS — I10 Essential (primary) hypertension: Secondary | ICD-10-CM | POA: Diagnosis not present

## 2018-12-23 DIAGNOSIS — M1A9XX0 Chronic gout, unspecified, without tophus (tophi): Secondary | ICD-10-CM | POA: Diagnosis not present

## 2018-12-23 DIAGNOSIS — I517 Cardiomegaly: Secondary | ICD-10-CM

## 2018-12-23 DIAGNOSIS — R600 Localized edema: Secondary | ICD-10-CM

## 2018-12-23 LAB — COMPREHENSIVE METABOLIC PANEL
ALT: 102 U/L — ABNORMAL HIGH (ref 0–53)
AST: 60 U/L — ABNORMAL HIGH (ref 0–37)
Albumin: 4.5 g/dL (ref 3.5–5.2)
Alkaline Phosphatase: 81 U/L (ref 39–117)
BUN: 10 mg/dL (ref 6–23)
CO2: 28 mEq/L (ref 19–32)
Calcium: 9.4 mg/dL (ref 8.4–10.5)
Chloride: 101 mEq/L (ref 96–112)
Creatinine, Ser: 0.86 mg/dL (ref 0.40–1.50)
GFR: 96.71 mL/min (ref >60.00)
Glucose, Bld: 110 mg/dL — ABNORMAL HIGH (ref 70–99)
Potassium: 3.7 mEq/L (ref 3.5–5.1)
Sodium: 141 mEq/L (ref 135–145)
Total Bilirubin: 0.7 mg/dL (ref 0.2–1.2)
Total Protein: 7.4 g/dL (ref 6.0–8.3)

## 2018-12-23 LAB — URIC ACID: Uric Acid, Serum: 7.8 mg/dL (ref 4.0–7.8)

## 2018-12-23 LAB — CBC
HCT: 47.1 % (ref 39.0–52.0)
Hemoglobin: 16.4 g/dL (ref 13.0–17.0)
MCHC: 34.8 g/dL (ref 30.0–36.0)
MCV: 88.7 fl (ref 78.0–100.0)
Platelets: 268 10*3/uL (ref 150.0–400.0)
RBC: 5.31 Mil/uL (ref 4.22–5.81)
RDW: 13.4 % (ref 11.5–15.5)
WBC: 8.7 10*3/uL (ref 4.0–10.5)

## 2018-12-23 LAB — BRAIN NATRIURETIC PEPTIDE: Pro B Natriuretic peptide (BNP): 53 pg/mL (ref 0.0–100.0)

## 2018-12-23 NOTE — Patient Instructions (Addendum)
It was good to see you today- I am sorry that your knee and legs are bothering you We are going to do some labs today to look for any sign of heart failure as the cause of your swelling.  I will also check your potassium and uric acid level  Assuming not heart failure related, then I suspect you have venous insufficiency (veins "leaking" while you are upright during the day). Compression socks can be very helpful in this case

## 2018-12-23 NOTE — Progress Notes (Addendum)
[facility] [address], [address] [place], [place] [id number] [phone] [phone]  Date:  12/23/2018   Name:  Brian JourneyDaniel Hensarling   DOB:  08/26/1975   MRN:  [id number]  PCP:  Pearline Cablesopland,  C, MD    Chief Complaint: Leg Swelling (bilateral leg swelling, going on for several months, right knee pain)   History of Present Illness:  Brian Rhodes is a 43 y.o. very pleasant male patient who presents with the following:  Here today for concern of leg swelling  He sees cardiology as well for HTN and LVH His cardiologist is Dr. Dulce SellarMunley  Echo from February of this year: IMPRESSIONS  1. The left ventricle has normal systolic function with an ejection fraction of 60-65%. The cavity size was normal. There is moderately increased left ventricular wall thickness. Left ventricular diastolic Doppler parameters are consistent with impaired relaxation.  2. The right ventricle has normal systolic function. The cavity was normal. There is no increase in right ventricular wall thickness.  3. The mitral valve is normal in structure.  4. The tricuspid valve is normal in structure.  5. The aortic valve is tricuspid Mild thickening of the aortic valve.  6. The pulmonic valve was normal in structure.  7. The aortic root is normal in size and structure.  8. Normal LV systolic function; moderate LVH more prominent at the basal septum; mild diastolic dysfunction; systolic anterior motion of MV but no significant LVOT gradient at rest; possible HOCM; suggest cardiac MRI to further assess.  They have so far not pursued a cardiac MRI for this yet due to expense and lack of symptoms I referred him to ortho for his right knee pain in May of this year He is seeing [facility] and PT for his right knee issue He did have an MRI for [facility], it showed no meniscal tear but significant arthritis  He has noted swelling in his bilateral feet and lower legs.  He has noted this  since early May-  He thought this might be related to taking NSAIDs- however he stopped using these and it did not help  He has never had swelling like this in the past In the am his legs look ok- they swell as the day goes on He does have some compression socks, just was not sure if this was the right course to take  MRI right knee done through [facility] 6/18 IMPRESSION: 1. No meniscal or ligamentous injury. 2. High-grade near full-thickness cartilage loss in the lateral patellofemoral compartment. 3. Small joint effusion. Suprapatellar and medial plica thickening.  Wt Readings from Last 3 Encounters:  12/23/18 286 lb (129.7 kg)  08/21/18 283 lb (128.4 kg)  07/18/18 278 lb (126.1 kg)   He is a lot less active since he hurt his knee, he is walking around less and using a crutch in his left hand  He is checking his BP at home on a regular basis- average BP is 135/89 for the month of June  Patient Active Problem List   Diagnosis Date Noted  . Left ventricular hypertrophy 08/21/2018  . Hypertensive heart disease 08/21/2018  . Gout 08/21/2018  . Abnormal EKG 08/21/2018    Past Medical History:  Diagnosis Date  . Allergy   . Chicken pox   . GERD (gastroesophageal reflux disease)   . Gout   . Hyperlipidemia   . Hypertension     Past Surgical History:  Procedure Laterality Date  .  WISDOM TOOTH EXTRACTION  2015    Social History   Tobacco Use  . Smoking status: Never Smoker  . Smokeless tobacco: Former NeurosurgeonUser    Types: Chew  Substance Use Topics  . Alcohol use: Yes    Comment: occasionally  . Drug use: No    Family History  Problem Relation Age of Onset  . Diabetes Mother   . Hyperlipidemia Mother   . Hypertension Mother   . Miscarriages / [place] Mother   . Arthritis Father   . Diabetes Father   . Hyperlipidemia Father   . Hypertension Father   . Hypertension Sister   . Hypertension Brother   . Hypertension Brother   . Hypertension Brother     No  Known Allergies  Medication list has been reviewed and updated.  Current Outpatient Medications on File Prior to Visit  Medication Sig Dispense Refill  . allopurinol (ZYLOPRIM) 100 MG tablet Take 100 mg by mouth daily. Taking quarter of a tablet    . guaiFENesin (MUCINEX) 600 MG 12 hr tablet Take by mouth 2 (two) times daily.    . nebivolol (BYSTOLIC) 10 MG tablet Take 1 tablet (10 mg total) by mouth daily. 90 tablet 1  . telmisartan-hydrochlorothiazide (MICARDIS HCT) 40-12.5 MG tablet Take 1 tablet by mouth daily. 90 tablet 3  . potassium chloride SA (KLOR-CON M20) 20 MEQ tablet Take 1 tablet (20 mEq total) by mouth daily. 90 tablet 2   No current facility-administered medications on file prior to visit.     Review of Systems:  As per HPI- otherwise negative. He does not get claudication  No orthopnea He has gained weight but is less active  He does not have any particular SOB or CP   Physical Examination: Vitals:   12/23/18 0955 12/23/18 1028  BP: (!) 166/100 (!) 148/95  Pulse: 62   Resp: 18   Temp: 98.1 F (36.7 C)   SpO2: 96%    Vitals:   12/23/18 0955  Weight: 286 lb (129.7 kg)  Height: 5\' 11"  (1.803 m)   Body mass index is 39.89 kg/m. Ideal Body Weight: Weight in (lb) to have BMI = 25: 178.9  GEN: WDWN, NAD, Non-toxic, A & O x 3, obese, looks well HEENT: Atraumatic, Normocephalic. Neck supple. No masses, No LAD. Ears and Nose: No external deformity. CV: RRR, No M/G/R. No JVD. No thrill. No extra heart sounds. PULM: CTA B, no wheezes, crackles, rhonchi. No retractions. No resp. distress. No accessory muscle use. EXTR: No c/c/e NEURO Normal gait.  PSYCH: Normally interactive. Conversant. Not depressed or anxious appearing.  Calm demeanor.  He has mild edema of both feet and sock line is apparent  Monofilament testing is normal.  Cannot palpate pulses but this may be due to swelling Right knee is painful with limited range of motion  Assessment and Plan:    ICD-10-CM   1. Bilateral leg edema  R60.0 B Nat Peptide    CBC    Comprehensive metabolic panel    CANCELED: Basic metabolic panel  2. Essential hypertension  I10   3. 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  No orders of the defined types were placed in this encounter.  Orders Placed This Encounter  Procedures  . DG Chest 2 View  . B Nat Peptide  . Uric acid  . CBC  .  Comprehensive metabolic panel   Here today with concern of knee pain and bilateral lower extremity edema.  His knee pain is been evaluated by orthopedics.  It sounds like he had a steroid injection so far.  I also mentioned Synvisc, which might be helpful for him  He has history of gout but has been taking his allopurinol He does report that orthopedics thought his knee pain might be related to gout.  We will check a uric acid today, can increase dose of allopurinol if indicated  Suspect lower extremity edema is due to venous insufficiency and decreased activity.  However, he does also have potential cardiac concerns.  Will obtain a chest x-ray and BNP today If these are normal would recommend compression socks  Signed Lamar Blinks, MD Received his chest film, it is normal.  Message to patient  Dg Chest 2 View  Result Date: 12/23/2018 CLINICAL DATA:  Leg swelling. EXAM: CHEST - 2 VIEW COMPARISON:  01/02/2012 FINDINGS: Heart size is normal. Mediastinal shadows are normal. The lungs are clear. No bronchial thickening. No infiltrate, mass, effusion or collapse. Pulmonary vascularity is normal. No bony abnormality. IMPRESSION: Normal chest Electronically Signed   By: Nelson Chimes M.D.   On: 12/23/2018 11:17   Received his labs 6/30- message to pt  Results for orders placed or performed in visit on 12/23/18  B Nat Peptide  Result Value Ref Range   Pro B Natriuretic peptide  (BNP) 53.0 0.0 - 100.0 pg/mL  Uric acid  Result Value Ref Range   Uric Acid, Serum 7.8 4.0 - 7.8 mg/dL  CBC  Result Value Ref Range   WBC 8.7 4.0 - 10.5 K/uL   RBC 5.31 4.22 - 5.81 Mil/uL   Platelets 268.0 150.0 - 400.0 K/uL   Hemoglobin 16.4 13.0 - 17.0 g/dL   HCT 47.1 39.0 - 52.0 %   MCV 88.7 78.0 - 100.0 fl   MCHC 34.8 30.0 - 36.0 g/dL   RDW 13.4 11.5 - 15.5 %  Comprehensive metabolic panel  Result Value Ref Range   Sodium 141 135 - 145 mEq/L   Potassium 3.7 3.5 - 5.1 mEq/L   Chloride 101 96 - 112 mEq/L   CO2 28 19 - 32 mEq/L   Glucose, Bld 110 (H) 70 - 99 mg/dL   BUN 10 6 - 23 mg/dL   Creatinine, Ser 0.86 0.40 - 1.50 mg/dL   Total Bilirubin 0.7 0.2 - 1.2 mg/dL   Alkaline Phosphatase 81 39 - 117 U/L   AST 60 (H) 0 - 37 U/L   ALT 102 (H) 0 - 53 U/L   Total Protein 7.4 6.0 - 8.3 g/dL   Albumin 4.5 3.5 - 5.2 g/dL   Calcium 9.4 8.4 - 10.5 mg/dL   GFR 96.71 >60.00 mL/min

## 2018-12-24 ENCOUNTER — Encounter: Payer: Self-pay | Admitting: Family Medicine

## 2018-12-24 DIAGNOSIS — M1A9XX0 Chronic gout, unspecified, without tophus (tophi): Secondary | ICD-10-CM

## 2018-12-26 MED ORDER — ALLOPURINOL 100 MG PO TABS: 200.0000 mg | ORAL_TABLET | Freq: Every day | ORAL | 3 refills | Status: DC

## 2018-12-26 NOTE — Addendum Note (Signed)
Addended by: Lamar Blinks C on: 12/26/2018 05:54 AM   Modules accepted: Orders

## 2018-12-30 ENCOUNTER — Ambulatory Visit: Payer: 59 | Admitting: Family Medicine

## 2019-01-07 ENCOUNTER — Encounter: Payer: Self-pay | Admitting: Family Medicine

## 2019-03-02 NOTE — Progress Notes (Signed)
Cardiology Office Note:    Date:  03/04/2019   ID:  Brian Rhodes, DOB August 21, 1975, MRN [id number]  PCP:  Darreld Mclean, MD  Cardiologist:  Shirlee More, MD    Referring MD: Darreld Mclean, MD    ASSESSMENT:    1. Hypertensive heart disease without heart failure   2. Left ventricular hypertrophy    PLAN:    In order of problems listed above:  1. Moved BP at target meds tolerated continue current regimen 2. Secondary to hypertension we again discussed the potential of cardiac MR and neither of [place] feel it is required at this time   Next appointment: Needed he will follow-up for hypertension with his primary care physician   Medication Adjustments/Labs and Tests Ordered: Current medicines are reviewed at length with the patient today.  Concerns regarding medicines are outlined above.  No orders of the defined types were placed in this encounter.  No orders of the defined types were placed in this encounter.   No chief complaint on file.   History of Present Illness:    Brian Rhodes is a 43 y.o. male with a hx of hypertension and LVH last seen 08/21/2018. Compliance with diet, lifestyle and medications: Yes  Pressure is better controlled averaging home 131/84 and infrequently greater than 90 diastolic.  He tolerates his treatment including beta-blocker selective vasodilator Bystolic in combination ARB thiazide diuretic.  He is bothered by knee pain which is due to arthritis and is involved in physical therapy as mild dependent edema is improved with compression hose.  Recent renal function and potassium are normal.  No chest pain shortness of breath palpitation or syncope  Echo 08/19/18:   IMPRESSIONS  1. The left ventricle has normal systolic function with an ejection fraction of 60-65%. The cavity size was normal. There is moderately increased left ventricular wall thickness. Left ventricular diastolic Doppler parameters are consistent with impaired  relaxation.   2. The right ventricle has normal systolic function. The cavity was normal. There is no increase in right ventricular wall thickness.  3. The mitral valve is normal in structure.  4. The tricuspid valve is normal in structure.  5. The aortic valve is tricuspid Mild thickening of the aortic valve.  6. The pulmonic valve was normal in structure.  7. The aortic root is normal in size and structure.  8. Normal LV systolic function; moderate LVH more prominent at the basal septum; mild diastolic dysfunction; systolic anterior motion of MV but no significant LVOT gradient at rest; possible HOCM; suggest cardiac MRI to further assess.  Past Medical History:  Diagnosis Date  . Allergy   . Chicken pox   . GERD (gastroesophageal reflux disease)   . Gout   . Hyperlipidemia   . Hypertension     Past Surgical History:  Procedure Laterality Date  . WISDOM TOOTH EXTRACTION  2015    Current Medications: Current Meds  Medication Sig  . allopurinol (ZYLOPRIM) 100 MG tablet Take 100 mg by mouth daily.  Marland Kitchen guaiFENesin (MUCINEX) 600 MG 12 hr tablet Take by mouth 2 (two) times daily.  . nebivolol (BYSTOLIC) 10 MG tablet Take 1 tablet (10 mg total) by mouth daily.  . potassium chloride SA (KLOR-CON M20) 20 MEQ tablet Take 1 tablet (20 mEq total) by mouth daily.  Marland Kitchen telmisartan-hydrochlorothiazide (MICARDIS HCT) 40-12.5 MG tablet Take 1 tablet by mouth daily.     Allergies:   Patient has no known allergies.   Social History   Socioeconomic  History  . Marital status: Married    Spouse name: Not on file  . Number of children: Not on file  . Years of education: Not on file  . Highest education level: Not on file  Occupational History  . Not on file  Social Needs  . Financial resource strain: Not on file  . Food insecurity    Worry: Not on file    Inability: Not on file  . Transportation needs    Medical: Not on file    Non-medical: Not on file  Tobacco Use  . Smoking status: Never Smoker   . Smokeless tobacco: Never Used  Substance and Sexual Activity  . Alcohol use: Yes    Comment: occasionally  . Drug use: No  . Sexual activity: Not on file  Lifestyle  . Physical activity    Days per week: Not on file    Minutes per session: Not on file  . Stress: Not on file  Relationships  . Social Musicianconnections    Talks on phone: Not on file    Gets together: Not on file    Attends religious service: Not on file    Active member of club or organization: Not on file    Attends meetings of clubs or organizations: Not on file    Relationship status: Not on file  Other Topics Concern  . Not on file  Social History Narrative  . Not on file     Family History: The patient's family history includes Arthritis in his father; Diabetes in his father and mother; Hyperlipidemia in his father and mother; Hypertension in his brother, brother, brother, father, mother, and sister; Miscarriages / [place] in his mother. ROS:   Please see the history of present illness.    All other systems reviewed and are negative.  EKGs/Labs/Other Studies Reviewed:    The following studies were reviewed today:   Recent Labs: 12/23/2018: ALT 102; BUN 10; Creatinine, Ser 0.86; Hemoglobin 16.4; Platelets 268.0; Potassium 3.7; Pro B Natriuretic peptide (BNP) 53.0; Sodium 141  Recent Lipid Panel    Component Value Date/Time   CHOL 190 08/01/2018   TRIG 311 (A) 08/01/2018   HDL 27 (A) 08/01/2018   LDLCALC 101 08/01/2018    Physical Exam:    VS:  BP (!) 142/100 (BP Location: Left Arm, Patient Position: Sitting, Cuff Size: Large)   Pulse 62   Temp (!) 97 F (36.1 C)   Ht 5\' 11"  (1.803 m)   Wt 287 lb (130.2 kg)   SpO2 98%   BMI 40.03 kg/m     Wt Readings from Last 3 Encounters:  03/04/19 287 lb (130.2 kg)  12/23/18 286 lb (129.7 kg)  08/21/18 283 lb (128.4 kg)    The blood pressure by me 130/90 large cuff seated right arm GEN:  Well nourished, well developed in no acute distress HEENT:  Normal NECK: No JVD; No carotid bruits LYMPHATICS: No lymphadenopathy CARDIAC: RRR, no murmurs, rubs, gallops RESPIRATORY:  Clear to auscultation without rales, wheezing or rhonchi  ABDOMEN: Soft, non-tender, non-distended MUSCULOSKELETAL: Lateral 1+ at the ankle pitting edema; No deformity  SKIN: Warm and dry NEUROLOGIC:  Alert and oriented x 3 PSYCHIATRIC:  Normal affect    Signed, Norman HerrlichBrian , MD  03/04/2019 8:40 AM    [facility]

## 2019-03-04 ENCOUNTER — Encounter: Payer: Self-pay | Admitting: Cardiology

## 2019-03-04 ENCOUNTER — Other Ambulatory Visit: Payer: Self-pay

## 2019-03-04 ENCOUNTER — Ambulatory Visit (INDEPENDENT_AMBULATORY_CARE_PROVIDER_SITE_OTHER): Payer: 59 | Admitting: Cardiology

## 2019-03-04 VITALS — BP 142/100 | HR 62 | Temp 97.0°F | Ht 71.0 in | Wt 287.0 lb

## 2019-03-04 DIAGNOSIS — I119 Hypertensive heart disease without heart failure: Secondary | ICD-10-CM | POA: Diagnosis not present

## 2019-03-04 DIAGNOSIS — I517 Cardiomegaly: Secondary | ICD-10-CM

## 2019-03-04 NOTE — Patient Instructions (Signed)
Medication Instructions:  Your physician recommends that you continue on your current medications as directed. Please refer to the Current Medication list given to you today.  If you need a refill on your cardiac medications before your next appointment, please call your pharmacy.   Lab work: None  If you have labs (blood work) drawn today and your tests are completely normal, you will receive your results only by: . MyChart Message (if you have MyChart) OR . A paper copy in the mail If you have any lab test that is abnormal or we need to change your treatment, we will call you to review the results.  Testing/Procedures: None   Follow-Up: At [facility], you and your health needs are our priority.  As part of our continuing mission to provide you with exceptional heart care, we have created designated Provider Care Teams.  These Care Teams include your primary Cardiologist (physician) and Advanced Practice Providers (APPs -  Physician Assistants and Nurse Practitioners) who all work together to provide you with the care you need, when you need it. You will need a follow up appointment as needed if symptoms worsen or fail to improve.    

## 2019-03-18 ENCOUNTER — Other Ambulatory Visit: Payer: Self-pay

## 2019-03-18 MED ORDER — NEBIVOLOL HCL 10 MG PO TABS: 10.0000 mg | ORAL_TABLET | Freq: Every day | ORAL | 1 refills | Status: DC

## 2019-03-18 MED ORDER — TELMISARTAN-HCTZ 40-12.5 MG PO TABS: 1.0000 | ORAL_TABLET | Freq: Every day | ORAL | 1 refills | Status: DC

## 2019-06-04 ENCOUNTER — Encounter: Payer: Self-pay | Admitting: Family Medicine

## 2019-06-06 ENCOUNTER — Telehealth: Payer: Self-pay | Admitting: Cardiology

## 2019-06-06 NOTE — Telephone Encounter (Signed)
Patient is needing a prior auth for his bystolic

## 2019-06-11 NOTE — Telephone Encounter (Signed)
Prior authorization for bystolic has been completed on [email or web address]. Will contact patient with updates as they are received.

## 2019-06-12 NOTE — Telephone Encounter (Signed)
Called patient with no answer and unable to leave message. Sent MyChart message advising that prior authorization for bystolic has been approved. Patient called right back and was informed of approval. He verbalized understanding. No further questions.

## 2019-07-20 NOTE — Progress Notes (Addendum)
Brian Rhodes [address], [address] [place], [place] [address] [phone]  Date:  07/23/2019   Name:  Brian Rhodes   DOB:  Jun 14, 1976   MRN:  [id number]  PCP:  Pearline Cables, MD    Chief Complaint: Annual Exam (ENT referral covid follow up, sinus congestion ever since) and Knee Pain (right knee pain, inflammed knee, muscle atrophy )   History of Present Illness:  Brian Rhodes is a 44 y.o. very pleasant male patient who presents with the following:  Patient with history of LVH, hypertension, gout, hyperlipidemia Here today for complete physical as well as a couple of concerns He had covid 19 in November  Since that time he has had some nasal congestion and green discharge from his nose, and his ears are congested  He did take a course of abx already for possible sinus infection- did not help long term however.  He would like to see ear nose and throat for evaluation of possible  Last seen by myself in June-at that time he was having some issues with leg swelling/ knee issues He has seen DR Lorella Nimrod with [facility] He is doing PT for his knee, and doing significantly better.  He just finished PT  Echo February 2020 shows normal EF with increased left ventricular wall thickness  In June his uric acid level was elevated, I suggested increasing his allopurinol dose-he increased from 100 to 150 mg and had improvement of gout symptoms  His cardiologist is Dr. Dulce Sellar He is not generally having palpitations unless he misses his K supplement  Tdap- he thinks done in 2015 prior to travel to [place] Flu vaccine- he declines today  Routine labs-can do today  He also gets some of his care at the [place] in [place] He is married, works in Clinical biochemist.  3 children between the ages of 53 and 70 His oldest is taking classes at [employer] and working  He is wearing a wrist monitor which tracks his pulses where less his oxygen  saturation.  He notes that his sleeping oxygen saturations may drop into the lower 80s at times.  He does snore.  He is not aware of apnea, feels generally well rested  BP Readings from Last 3 Encounters:  07/23/19 (!) 150/100  03/04/19 (!) 142/100  12/23/18 (!) 148/95   Pulse Readings from Last 3 Encounters:  07/23/19 70  03/04/19 62  12/23/18 62     Patient Active Problem List   Diagnosis Date Noted  . Left ventricular hypertrophy 08/21/2018  . Hypertensive heart disease 08/21/2018  . Gout 08/21/2018  . Abnormal EKG 08/21/2018    Past Medical History:  Diagnosis Date  . Allergy   . Chicken pox   . GERD (gastroesophageal reflux disease)   . Gout   . Hyperlipidemia   . Hypertension     Past Surgical History:  Procedure Laterality Date  . WISDOM TOOTH EXTRACTION  2015    Social History   Tobacco Use  . Smoking status: Never Smoker  . Smokeless tobacco: Never Used  Substance Use Topics  . Alcohol use: Yes    Comment: occasionally  . Drug use: No    Family History  Problem Relation Age of Onset  . Diabetes Mother   . Hyperlipidemia Mother   . Hypertension Mother   . Miscarriages / [place] Mother   . Arthritis Father   . Diabetes Father   . Hyperlipidemia Father   .  Hypertension Father   . Hypertension Sister   . Hypertension Brother   . Hypertension Brother   . Hypertension Brother     No Known Allergies  Medication list has been reviewed and updated.  Current Outpatient Medications on File Prior to Visit  Medication Sig Dispense Refill  . allopurinol (ZYLOPRIM) 100 MG tablet Take 100 mg by mouth daily.    . nebivolol (BYSTOLIC) 10 MG tablet Take 1 tablet (10 mg total) by mouth daily. 90 tablet 1  . potassium chloride SA (KLOR-CON M20) 20 MEQ tablet Take 1 tablet (20 mEq total) by mouth daily. 90 tablet 2  . telmisartan-hydrochlorothiazide (MICARDIS HCT) 40-12.5 MG tablet Take 1 tablet by mouth daily. 90 tablet 1   No current  facility-administered medications on file prior to visit.    Review of Systems:  As per HPI- otherwise negative.   Physical Examination: Vitals:   07/23/19 0946  BP: (!) 150/100  Pulse: 70  Resp: 16  Temp: (!) 96.6 F (35.9 C)  SpO2: 96%   Vitals:   07/23/19 0946  Weight: 284 lb (128.8 kg)  Height: 5\' 11"  (1.803 m)   Body mass index is 39.61 kg/m. Ideal Body Weight: Weight in (lb) to have BMI = 25: 178.9  GEN: WDWN, NAD, Non-toxic, A & O x 3, obese, otherwise looks well HEENT: Atraumatic, Normocephalic. Neck supple. No masses, No LAD.  TM and ear canals within normal limits bilaterally Ears and Nose: No external deformity. CV: RRR, No M/G/R. No JVD. No thrill. No extra heart sounds. PULM: CTA B, no wheezes, crackles, rhonchi. No retractions. No resp. distress. No accessory muscle use. ABD: S, NT, ND, +BS. No rebound. No HSM.  Belly is benign EXTR: No c/c/e NEURO Normal gait.  PSYCH: Normally interactive. Conversant. Not depressed or anxious appearing.  Calm demeanor.  He continues to have some stiffness and tenderness of the right knee  Assessment and Plan: Physical exam  Chronic gout without tophus, unspecified cause, unspecified site - Plan: Uric acid  Essential hypertension - Plan: CBC, Comprehensive metabolic panel, potassium chloride SA (KLOR-CON M20) 20 MEQ tablet, nebivolol (BYSTOLIC) 10 MG tablet, telmisartan-hydrochlorothiazide (MICARDIS HCT) 80-12.5 MG tablet  Left ventricular hypertrophy  Family history of early CAD - Plan: Lipid panel  Screening for diabetes mellitus - Plan: Comprehensive metabolic panel, Hemoglobin A1c  Screening for HIV (human immunodeficiency virus) - Plan: CANCELED: HIV Antibody (routine testing w rflx)  Sensation of fullness in both ears - Plan: Ambulatory referral to ENT, CANCELED: Ambulatory referral to ENT  Following up today for physical Labs pending as above Referral to ENT [facility]  He will let me know if a private outpatient consultation is needed We increased telmisartan to 80 mg due to elevated blood pressure.  I will be in touch with his potassium  This visit occurred during the SARS-CoV-2 public health emergency.  Safety protocols were in place, including screening questions prior to the visit, additional usage of staff PPE, and extensive cleaning of exam room while observing appropriate contact time as indicated for disinfecting solutions.    Signed Lamar Blinks, MD  Received his labs, message to patient LFTs are improved from previous Lipids not better Results for orders placed or performed in visit on 07/23/19  CBC  Result Value Ref Range   WBC 9.8 4.0 - 10.5 K/uL   RBC 5.46 4.22 - 5.81 Mil/uL   Platelets 256.0 150.0 - 400.0 K/uL  Hemoglobin 16.7 13.0 - 17.0 g/dL   HCT 82.9 56.2 - 13.0 %   MCV 88.4 78.0 - 100.0 fl   MCHC 34.7 30.0 - 36.0 g/dL   RDW 86.5 78.4 - 69.6 %  Comprehensive metabolic panel  Result Value Ref Range   Sodium 140 135 - 145 mEq/L   Potassium 3.7 3.5 - 5.1 mEq/L   Chloride 101 96 - 112 mEq/L   CO2 30 19 - 32 mEq/L   Glucose, Bld 110 (H) 70 - 99 mg/dL   BUN 12 6 - 23 mg/dL   Creatinine, Ser 2.95 0.40 - 1.50 mg/dL   Total Bilirubin 0.6 0.2 - 1.2 mg/dL   Alkaline Phosphatase 88 39 - 117 U/L   AST 35 0 - 37 U/L   ALT 65 (H) 0 - 53 U/L   Total Protein 7.3 6.0 - 8.3 g/dL   Albumin 4.5 3.5 - 5.2 g/dL   GFR 284.13 >24.40 mL/min   Calcium 9.3 8.4 - 10.5 mg/dL  Hemoglobin N0U  Result Value Ref Range   Hgb A1c MFr Bld 5.8 4.6 - 6.5 %  Lipid panel  Result Value Ref Range   Cholesterol 201 (H) 0 - 200 mg/dL   Triglycerides 725.3 (H) 0.0 - 149.0 mg/dL   HDL 66.44 (L) >03.47 mg/dL   VLDL 42.5 (H) 0.0 - 95.6 mg/dL   Total CHOL/HDL Ratio 7    NonHDL 172.28   Uric acid  Result Value Ref Range   Uric Acid, Serum 7.4 4.0 - 7.8 mg/dL  LDL cholesterol, direct  Result Value Ref Range   Direct LDL 105.0 mg/dL

## 2019-07-20 NOTE — Patient Instructions (Addendum)
It was great to see you again today, I will be in touch with your labs as soon as possible We will increase your telmisartan to 80 mg- continue 12.4 of HCTZ Continue bystolic and potassium We will set you up to see ENT  Please discuss possible sleep apnea testing with your [facility] provider    Health Maintenance, Male Adopting a healthy lifestyle and getting preventive care are important in promoting health and wellness. Ask your health care provider about:  The right schedule for you to have regular tests and exams.  Things you can do on your own to prevent diseases and keep yourself healthy. What should I know about diet, weight, and exercise? Eat a healthy diet   Eat a diet that includes plenty of vegetables, fruits, low-fat dairy products, and lean protein.  Do not eat a lot of foods that are high in solid fats, added sugars, or sodium. Maintain a healthy weight Body mass index (BMI) is a measurement that can be used to identify possible weight problems. It estimates body fat based on height and weight. Your health care provider can help determine your BMI and help you achieve or maintain a healthy weight. Get regular exercise Get regular exercise. This is one of the most important things you can do for your health. Most adults should:  Exercise for at least 150 minutes each week. The exercise should increase your heart rate and make you sweat (moderate-intensity exercise).  Do strengthening exercises at least twice a week. This is in addition to the moderate-intensity exercise.  Spend less time sitting. Even light physical activity can be beneficial. Watch cholesterol and blood lipids Have your blood tested for lipids and cholesterol at 45 years of age, then have this test every 5 years. You may need to have your cholesterol levels checked more often if:  Your lipid or cholesterol levels are high.  You are older than 45 years of age.  You are at high risk for heart disease. What  should I know about cancer screening? Many types of cancers can be detected early and may often be prevented. Depending on your health history and family history, you may need to have cancer screening at various ages. This may include screening for:  Colorectal cancer.  Prostate cancer.  Skin cancer.  Lung cancer. What should I know about heart disease, diabetes, and high blood pressure? Blood pressure and heart disease  High blood pressure causes heart disease and increases the risk of stroke. This is more likely to develop in people who have high blood pressure readings, are of African descent, or are overweight.  Talk with your health care provider about your target blood pressure readings.  Have your blood pressure checked: ? Every 3-5 years if you are 93-36 years of age. ? Every year if you are 60 years old or older.  If you are between the ages of 5 and 49 and are a current or former smoker, ask your health care provider if you should have a one-time screening for abdominal aortic aneurysm (AAA). Diabetes Have regular diabetes screenings. This checks your fasting blood sugar level. Have the screening done:  Once every three years after age 30 if you are at a normal weight and have a low risk for diabetes.  More often and at a younger age if you are overweight or have a high risk for diabetes. What should I know about preventing infection? Hepatitis B If you have a higher risk for hepatitis B, you  should be screened for this virus. Talk with your health care provider to find out if you are at risk for hepatitis B infection. Hepatitis C Blood testing is recommended for:  Everyone born from [birth year] through 1966.  Anyone with known risk factors for hepatitis C. Sexually transmitted infections (STIs)  You should be screened each year for STIs, including gonorrhea and chlamydia, if: ? You are sexually active and are younger than 45 years of age. ? You are older than 45 years of  age and your health care provider tells you that you are at risk for this type of infection. ? Your sexual activity has changed since you were last screened, and you are at increased risk for chlamydia or gonorrhea. Ask your health care provider if you are at risk.  Ask your health care provider about whether you are at high risk for HIV. Your health care provider may recommend a prescription medicine to help prevent HIV infection. If you choose to take medicine to prevent HIV, you should first get tested for HIV. You should then be tested every 3 months for as long as you are taking the medicine. Follow these instructions at home: Lifestyle  Do not use any products that contain nicotine or tobacco, such as cigarettes, e-cigarettes, and chewing tobacco. If you need help quitting, ask your health care provider.  Do not use street drugs.  Do not share needles.  Ask your health care provider for help if you need support or information about quitting drugs. Alcohol use  Do not drink alcohol if your health care provider tells you not to drink.  If you drink alcohol: ? Limit how much you have to 0-2 drinks a day. ? Be aware of how much alcohol is in your drink. In the [place] one drink equals one 12 oz bottle of beer (355 mL), one 5 oz glass of wine (148 mL), or one 1 oz glass of hard liquor (44 mL). General instructions  Schedule regular health, dental, and eye exams.  Stay current with your vaccines.  Tell your health care provider if: ? You often feel depressed. ? You have ever been abused or do not feel safe at home. Summary  Adopting a healthy lifestyle and getting preventive care are important in promoting health and wellness.  Follow your health care provider's instructions about healthy diet, exercising, and getting tested or screened for diseases.  Follow your health care provider's instructions on monitoring your cholesterol and blood pressure. This information is not intended  to replace advice given to you by your health care provider. Make sure you discuss any questions you have with your health care provider. Document Revised: 06/05/2018 Document Reviewed: 06/05/2018 Elsevier Patient Education  2020 [employer].

## 2019-07-23 ENCOUNTER — Encounter: Payer: Self-pay | Admitting: Family Medicine

## 2019-07-23 ENCOUNTER — Other Ambulatory Visit: Payer: Self-pay

## 2019-07-23 ENCOUNTER — Ambulatory Visit (INDEPENDENT_AMBULATORY_CARE_PROVIDER_SITE_OTHER): Payer: 59 | Admitting: Family Medicine

## 2019-07-23 VITALS — BP 150/100 | HR 70 | Temp 96.6°F | Resp 16 | Ht 71.0 in | Wt 284.0 lb

## 2019-07-23 DIAGNOSIS — Z8249 Family history of ischemic heart disease and other diseases of the circulatory system: Secondary | ICD-10-CM

## 2019-07-23 DIAGNOSIS — R7401 Elevation of levels of liver transaminase levels: Secondary | ICD-10-CM

## 2019-07-23 DIAGNOSIS — Z114 Encounter for screening for human immunodeficiency virus [HIV]: Secondary | ICD-10-CM

## 2019-07-23 DIAGNOSIS — I517 Cardiomegaly: Secondary | ICD-10-CM | POA: Diagnosis not present

## 2019-07-23 DIAGNOSIS — Z131 Encounter for screening for diabetes mellitus: Secondary | ICD-10-CM

## 2019-07-23 DIAGNOSIS — I10 Essential (primary) hypertension: Secondary | ICD-10-CM

## 2019-07-23 DIAGNOSIS — Z0000 Encounter for general adult medical examination without abnormal findings: Secondary | ICD-10-CM | POA: Diagnosis not present

## 2019-07-23 DIAGNOSIS — H938X3 Other specified disorders of ear, bilateral: Secondary | ICD-10-CM

## 2019-07-23 DIAGNOSIS — M1A9XX0 Chronic gout, unspecified, without tophus (tophi): Secondary | ICD-10-CM

## 2019-07-23 LAB — COMPREHENSIVE METABOLIC PANEL
ALT: 65 U/L — ABNORMAL HIGH (ref 0–53)
AST: 35 U/L (ref 0–37)
Albumin: 4.5 g/dL (ref 3.5–5.2)
Alkaline Phosphatase: 88 U/L (ref 39–117)
BUN: 12 mg/dL (ref 6–23)
CO2: 30 mEq/L (ref 19–32)
Calcium: 9.3 mg/dL (ref 8.4–10.5)
Chloride: 101 mEq/L (ref 96–112)
Creatinine, Ser: 0.81 mg/dL (ref 0.40–1.50)
GFR: 103.35 mL/min (ref >60.00)
Glucose, Bld: 110 mg/dL — ABNORMAL HIGH (ref 70–99)
Potassium: 3.7 mEq/L (ref 3.5–5.1)
Sodium: 140 mEq/L (ref 135–145)
Total Bilirubin: 0.6 mg/dL (ref 0.2–1.2)
Total Protein: 7.3 g/dL (ref 6.0–8.3)

## 2019-07-23 LAB — HEMOGLOBIN A1C: Hgb A1c MFr Bld: 5.8 % (ref 4.6–6.5)

## 2019-07-23 LAB — CBC
HCT: 48.3 % (ref 39.0–52.0)
Hemoglobin: 16.7 g/dL (ref 13.0–17.0)
MCHC: 34.7 g/dL (ref 30.0–36.0)
MCV: 88.4 fl (ref 78.0–100.0)
Platelets: 256 10*3/uL (ref 150.0–400.0)
RBC: 5.46 Mil/uL (ref 4.22–5.81)
RDW: 13.1 % (ref 11.5–15.5)
WBC: 9.8 10*3/uL (ref 4.0–10.5)

## 2019-07-23 LAB — LIPID PANEL
Cholesterol: 201 mg/dL — ABNORMAL HIGH (ref 0–200)
HDL: 28.3 mg/dL — ABNORMAL LOW (ref >39.00)
NonHDL: 172.28
Total CHOL/HDL Ratio: 7
Triglycerides: 379 mg/dL — ABNORMAL HIGH (ref 0.0–149.0)
VLDL: 75.8 mg/dL — ABNORMAL HIGH (ref 0.0–40.0)

## 2019-07-23 LAB — LDL CHOLESTEROL, DIRECT: Direct LDL: 105 mg/dL

## 2019-07-23 LAB — URIC ACID: Uric Acid, Serum: 7.4 mg/dL (ref 4.0–7.8)

## 2019-07-23 MED ORDER — NEBIVOLOL HCL 10 MG PO TABS: 10.0000 mg | ORAL_TABLET | Freq: Every day | ORAL | 3 refills | Status: DC

## 2019-07-23 MED ORDER — POTASSIUM CHLORIDE CRYS ER 20 MEQ PO TBCR: 20.0000 meq | EXTENDED_RELEASE_TABLET | Freq: Every day | ORAL | 2 refills | Status: AC

## 2019-07-23 MED ORDER — TELMISARTAN-HCTZ 80-12.5 MG PO TABS: 1.0000 | ORAL_TABLET | Freq: Every day | ORAL | 3 refills | Status: DC

## 2019-07-23 NOTE — Addendum Note (Signed)
Addended by: Pearline Cables on: 07/23/2019 07:33 PM   Modules accepted: Orders

## 2019-07-31 ENCOUNTER — Other Ambulatory Visit: Payer: Self-pay

## 2019-07-31 ENCOUNTER — Ambulatory Visit (HOSPITAL_BASED_OUTPATIENT_CLINIC_OR_DEPARTMENT_OTHER)
Admission: RE | Admit: 2019-07-31 | Discharge: 2019-07-31 | Disposition: A | Discharge status: Home / Self Care | Payer: 59 | Source: Ambulatory Visit | Attending: Family Medicine | Admitting: Family Medicine

## 2019-07-31 ENCOUNTER — Encounter: Payer: Self-pay | Admitting: Family Medicine

## 2019-07-31 DIAGNOSIS — R7401 Elevation of levels of liver transaminase levels: Secondary | ICD-10-CM

## 2019-09-15 ENCOUNTER — Telehealth: Payer: Self-pay | Admitting: Family Medicine

## 2019-09-15 ENCOUNTER — Encounter: Payer: Self-pay | Admitting: Family Medicine

## 2019-09-15 DIAGNOSIS — I10 Essential (primary) hypertension: Secondary | ICD-10-CM

## 2019-09-15 NOTE — Telephone Encounter (Signed)
Pt wants to discuss alternative for med refill for his nebivolol (BYSTOLIC) 10 MG tablet   He states it's become very expensive and today he is down to his last pill. Please advise. Thanks

## 2019-09-16 MED ORDER — METOPROLOL SUCCINATE ER 25 MG PO TB24: 25.0000 mg | ORAL_TABLET | Freq: Every day | ORAL | 6 refills | Status: DC

## 2020-07-24 NOTE — Progress Notes (Addendum)
[facility] [address], [place], [place] [address] [phone]  Date:  07/28/2020   Name:  Katelyn Kohlmeyer   DOB:  11/21/1975   MRN:  [id number]  PCP:  Darreld Mclean, MD    Chief Complaint: Annual Exam   History of Present Illness:  Manford Sprong is a 45 y.o. very pleasant male patient who presents with the following:  Here today for a CPE and to discuss several other concerns  Patient with history of LVH/ hypertrophic cardiomyopathy, hypertension, gout, hyperlipidemia Last seen by myself about one year ago  He does get some care at the [facility]- he is not sure what labs he might have had done at the [place]  He is fasting today  Married, 3 children, works in [place] C screen -today  covid series- recommended booster  Flu vaccine- declines  Colon cancer screen- no family history  [facility] already re [facility] screening and they are managing this for him.  It sounds like they are sending him a cologuard kit  He was seen by oral and plastic surgery at [facility] last year and had surgery- from last visit in September Jasier Calabretta is a 45 y.o. 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 I first saw the patient in my clinic on 12/04/2019. At that visit, the patient's clinical history, examination, and radiographic evaluation were all consistent with a persistent left posterior maxillary orontral communication as the cause of the patient's persistent left maxillary sinusitis. I reviewed with him that his imaging studies revealed significant osteolysis of bone around tooth #15 and that in order to adequately treat his sinusitis and OAF he would require removal of tooth #15 as well as simultaneous buccal fat pad flap reconstruction. He wished to move forward with this, and he thus  underwent extraction of tooth #15 and repair of his left OAF with a buccal fat pad flap on 02/04/2020.  The patient returns now roughly 3 weeks out from his procedure. He is doing quite well. He denies any pain whatsoever and denies any regurgitation.   He has had some right knee issues - has been to see ortho several times  He has dx of hypertrophic cardiomyopathy and is seeing cardiology with the [place]- they plan to change him from bystolic to metoprolol for cost  He also notes recent dx of elevated LFTS and ?prediabetes    Pt notes he had covid in 2020  Wt Readings from Last 3 Encounters:  07/28/20 284 lb (128.8 kg)  07/23/19 284 lb (128.8 kg)  03/04/19 287 lb (130.2 kg)   He notes he has lost 10 lbs or so since his heaviest   Patient Active Problem List   Diagnosis Date Noted   Left ventricular hypertrophy 08/21/2018   Hypertensive heart disease 08/21/2018   Gout 08/21/2018   Abnormal EKG 08/21/2018    Past Medical History:  Diagnosis Date   Allergy    Chicken pox    GERD (gastroesophageal reflux disease)    Gout    Hyperlipidemia    Hypertension     Past Surgical History:  Procedure Laterality Date   WISDOM TOOTH EXTRACTION  2015    Social History   Tobacco Use   Smoking status: Never Smoker   Smokeless tobacco: Never Used  Vaping Use   Vaping Use: Never used  Substance Use Topics   Alcohol use: Yes    Comment: occasionally   Drug use: No    Family History  Problem Relation Age of Onset   Diabetes Mother    Hyperlipidemia Mother    Hypertension Mother    Miscarriages / [place] Mother    Arthritis Father    Diabetes Father    Hyperlipidemia Father    Hypertension Father    Hypertension Sister    Hypertension Brother    Hypertension Brother    Hypertension Brother     No Known Allergies  Medication list has been reviewed and updated.  Current Outpatient Medications on File Prior to Visit  Medication Sig Dispense  Refill   allopurinol (ZYLOPRIM) 100 MG tablet Take 100 mg by mouth daily.     BYSTOLIC 20 MG TABS Take 1 tablet by mouth daily.     losartan (COZAAR) 100 MG tablet Take 100 mg by mouth daily.     montelukast (SINGULAIR) 10 MG tablet Take 10 mg by mouth at bedtime.     potassium chloride SA (KLOR-CON M20) 20 MEQ tablet Take 1 tablet (20 mEq total) by mouth daily. 90 tablet 2   No current facility-administered medications on file prior to visit.    Review of Systems:  As per HPI- otherwise negative.   Physical Examination: Vitals:   07/28/20 0826  BP: 140/82  Pulse: 63  Resp: 18  SpO2: 99%   Vitals:   07/28/20 0826  Weight: 284 lb (128.8 kg)  Height: 5' 11"  (1.803 m)   Body mass index is 39.61 kg/m. Ideal Body Weight: Weight in (lb) to have BMI = 25: 178.9  GEN: no acute distress.  Obese, otherwise looks well  HEENT: Atraumatic, Normocephalic.  Ears and Nose: No external deformity. CV: RRR, No M/G/R. No JVD. No thrill. No extra heart sounds. PULM: CTA B, no wheezes, crackles, rhonchi. No retractions. No resp. distress. No accessory muscle use. ABD: S, NT, ND, +BS. No rebound. No HSM. EXTR: No c/c/e PSYCH: Normally interactive. Conversant.    Assessment and Plan: Physical exam  Essential hypertension - Plan: CBC, Comprehensive metabolic panel  Left ventricular hypertrophy - Plan: Lipid panel  Screening for diabetes mellitus - Plan: Hemoglobin A1c  Encounter for hepatitis C screening test for low risk patient - Plan: Hepatitis C antibody  Screening for hyperlipidemia - Plan: Lipid panel  Obesity due to excess calories without serious comorbidity, unspecified classification  Controlled type 2 diabetes mellitus without complication, without long-term current use of insulin (HCC)  Pt here today for a CPE He also gets care at the [place]- it sounds like they dx diabetes He has an rx for metformin 500 but is not taking; I encouraged him to go ahead and start on 500  daily, work up to BID Encouraged weight loss- this will likely help with all his health issues He is trying to exercise but has knee pain Cardiology care through the [place] Please get covid booster  This visit occurred during the SARS-CoV-2 public health emergency.  Safety protocols were in place, including screening questions prior to the visit, additional usage of staff PPE, and extensive cleaning of exam room while observing appropriate contact time as indicated for disinfecting solutions.    Signed Lamar Blinks, MD  Received his labs as below- message to pt  Results for orders placed or performed in visit on 07/28/20  CBC  Result Value Ref Range  WBC 8.2 4.0 - 10.5 K/uL   RBC 5.52 4.22 - 5.81 Mil/uL   Platelets 235.0 150.0 - 400.0 K/uL   Hemoglobin 17.0 13.0 - 17.0 g/dL   HCT 48.9 39.0 - 52.0 %   MCV 88.6 78.0 - 100.0 fl   MCHC 34.8 30.0 - 36.0 g/dL   RDW 12.7 11.5 - 15.5 %  Comprehensive metabolic panel  Result Value Ref Range   Sodium 138 135 - 145 mEq/L   Potassium 4.2 3.5 - 5.1 mEq/L   Chloride 101 96 - 112 mEq/L   CO2 33 (H) 19 - 32 mEq/L   Glucose, Bld 147 (H) 70 - 99 mg/dL   BUN 9 6 - 23 mg/dL   Creatinine, Ser 0.77 0.40 - 1.50 mg/dL   Total Bilirubin 0.8 0.2 - 1.2 mg/dL   Alkaline Phosphatase 112 39 - 117 U/L   AST 41 (H) 0 - 37 U/L   ALT 68 (H) 0 - 53 U/L   Total Protein 7.2 6.0 - 8.3 g/dL   Albumin 4.3 3.5 - 5.2 g/dL   GFR 108.23 >60.00 mL/min   Calcium 9.3 8.4 - 10.5 mg/dL  Hemoglobin A1c  Result Value Ref Range   Hgb A1c MFr Bld 6.4 4.6 - 6.5 %  Lipid panel  Result Value Ref Range   Cholesterol 191 0 - 200 mg/dL   Triglycerides 277.0 (H) 0.0 - 149.0 mg/dL   HDL 26.50 (L) >39.00 mg/dL   VLDL 55.4 (H) 0.0 - 40.0 mg/dL   Total CHOL/HDL Ratio 7    NonHDL 164.14   LDL cholesterol, direct  Result Value Ref Range   Direct LDL 108.0 mg/dL

## 2020-07-24 NOTE — Patient Instructions (Addendum)
It was good to see you today- I will be in touch with your labs [facility]; please let me know when you get this result  It does sound like you have diabetes- this is diagnosed if you have 2 A1c readings higher than 6.5% I would recommend starting on metformin- would start with 500 mg once or twice a day.  Can increase to twice a day as tolerated.   Losing weight will likely help to manage your diabetes, hypertension and fatty liver as well as your knee pain    Health Maintenance, Male Adopting a healthy lifestyle and getting preventive care are important in promoting health and wellness. Ask your health care provider about:  The right schedule for you to have regular tests and exams.  Things you can do on your own to prevent diseases and keep yourself healthy. What should I know about diet, weight, and exercise? Eat a healthy diet  Eat a diet that includes plenty of vegetables, fruits, low-fat dairy products, and lean protein.  Do not eat a lot of foods that are high in solid fats, added sugars, or sodium.   Maintain a healthy weight Body mass index (BMI) is a measurement that can be used to identify possible weight problems. It estimates body fat based on height and weight. Your health care provider can help determine your BMI and help you achieve or maintain a healthy weight. Get regular exercise Get regular exercise. This is one of the most important things you can do for your health. Most adults should:  Exercise for at least 150 minutes each week. The exercise should increase your heart rate and make you sweat (moderate-intensity exercise).  Do strengthening exercises at least twice a week. This is in addition to the moderate-intensity exercise.  Spend less time sitting. Even light physical activity can be beneficial. Watch cholesterol and blood lipids Have your blood tested for lipids and cholesterol at 46 years of age, then have this  test every 5 years. You may need to have your cholesterol levels checked more often if:  Your lipid or cholesterol levels are high.  You are older than 46 years of age.  You are at high risk for heart disease. What should I know about cancer screening? Many types of cancers can be detected early and may often be prevented. Depending on your health history and family history, you may need to have cancer screening at various ages. This may include screening for:  Colorectal cancer.  Prostate cancer.  Skin cancer.  Lung cancer. What should I know about heart disease, diabetes, and high blood pressure? Blood pressure and heart disease  High blood pressure causes heart disease and increases the risk of stroke. This is more likely to develop in people who have high blood pressure readings, are of African descent, or are overweight.  Talk with your health care provider about your target blood pressure readings.  Have your blood pressure checked: ? Every 3-5 years if you are 47-72 years of age. ? Every year if you are 17 years old or older.  If you are between the ages of 93 and 46 and are a current or former smoker, ask your health care provider if you should have a one-time screening for abdominal aortic aneurysm (AAA). Diabetes Have regular diabetes screenings. This checks your fasting blood sugar level. Have the screening done:  Once every three years after age 55 if you are at a  normal weight and have a low risk for diabetes.  More often and at a younger age if you are overweight or have a high risk for diabetes. What should I know about preventing infection? Hepatitis B If you have a higher risk for hepatitis B, you should be screened for this virus. Talk with your health care provider to find out if you are at risk for hepatitis B infection. Hepatitis C Blood testing is recommended for:  Everyone born from [birth year] through 1966.  Anyone with known risk factors for hepatitis  C. Sexually transmitted infections (STIs)  You should be screened each year for STIs, including gonorrhea and chlamydia, if: ? You are sexually active and are younger than 46 years of age. ? You are older than 46 years of age and your health care provider tells you that you are at risk for this type of infection. ? Your sexual activity has changed since you were last screened, and you are at increased risk for chlamydia or gonorrhea. Ask your health care provider if you are at risk.  Ask your health care provider about whether you are at high risk for HIV. Your health care provider may recommend a prescription medicine to help prevent HIV infection. If you choose to take medicine to prevent HIV, you should first get tested for HIV. You should then be tested every 3 months for as long as you are taking the medicine. Follow these instructions at home: Lifestyle  Do not use any products that contain nicotine or tobacco, such as cigarettes, e-cigarettes, and chewing tobacco. If you need help quitting, ask your health care provider.  Do not use street drugs.  Do not share needles.  Ask your health care provider for help if you need support or information about quitting drugs. Alcohol use  Do not drink alcohol if your health care provider tells you not to drink.  If you drink alcohol: ? Limit how much you have to 0-2 drinks a day. ? Be aware of how much alcohol is in your drink. In the [place] one drink equals one 12 oz bottle of beer (355 mL), one 5 oz glass of wine (148 mL), or one 1 oz glass of hard liquor (44 mL). General instructions  Schedule regular health, dental, and eye exams.  Stay current with your vaccines.  Tell your health care provider if: ? You often feel depressed. ? You have ever been abused or do not feel safe at home. Summary  Adopting a healthy lifestyle and getting preventive care are important in promoting health and wellness.  Follow your health care  provider's instructions about healthy diet, exercising, and getting tested or screened for diseases.  Follow your health care provider's instructions on monitoring your cholesterol and blood pressure. This information is not intended to replace advice given to you by your health care provider. Make sure you discuss any questions you have with your health care provider. Document Revised: 06/05/2018 Document Reviewed: 06/05/2018 Elsevier Patient Education  2021 [employer].

## 2020-07-28 ENCOUNTER — Other Ambulatory Visit: Payer: Self-pay

## 2020-07-28 ENCOUNTER — Encounter: Payer: Self-pay | Admitting: Family Medicine

## 2020-07-28 ENCOUNTER — Ambulatory Visit (INDEPENDENT_AMBULATORY_CARE_PROVIDER_SITE_OTHER): Payer: 59 | Admitting: Family Medicine

## 2020-07-28 VITALS — BP 140/82 | HR 63 | Resp 18 | Ht 71.0 in | Wt 284.0 lb

## 2020-07-28 DIAGNOSIS — Z0000 Encounter for general adult medical examination without abnormal findings: Secondary | ICD-10-CM

## 2020-07-28 DIAGNOSIS — E119 Type 2 diabetes mellitus without complications: Secondary | ICD-10-CM

## 2020-07-28 DIAGNOSIS — Z13220 Encounter for screening for lipoid disorders: Secondary | ICD-10-CM | POA: Diagnosis not present

## 2020-07-28 DIAGNOSIS — Z131 Encounter for screening for diabetes mellitus: Secondary | ICD-10-CM

## 2020-07-28 DIAGNOSIS — E6609 Other obesity due to excess calories: Secondary | ICD-10-CM

## 2020-07-28 DIAGNOSIS — I10 Essential (primary) hypertension: Secondary | ICD-10-CM

## 2020-07-28 DIAGNOSIS — I517 Cardiomegaly: Secondary | ICD-10-CM | POA: Diagnosis not present

## 2020-07-28 DIAGNOSIS — Z1159 Encounter for screening for other viral diseases: Secondary | ICD-10-CM

## 2020-07-28 LAB — COMPREHENSIVE METABOLIC PANEL
ALT: 68 U/L — ABNORMAL HIGH (ref 0–53)
AST: 41 U/L — ABNORMAL HIGH (ref 0–37)
Albumin: 4.3 g/dL (ref 3.5–5.2)
Alkaline Phosphatase: 112 U/L (ref 39–117)
BUN: 9 mg/dL (ref 6–23)
CO2: 33 mEq/L — ABNORMAL HIGH (ref 19–32)
Calcium: 9.3 mg/dL (ref 8.4–10.5)
Chloride: 101 mEq/L (ref 96–112)
Creatinine, Ser: 0.77 mg/dL (ref 0.40–1.50)
GFR: 108.23 mL/min (ref >60.00)
Glucose, Bld: 147 mg/dL — ABNORMAL HIGH (ref 70–99)
Potassium: 4.2 mEq/L (ref 3.5–5.1)
Sodium: 138 mEq/L (ref 135–145)
Total Bilirubin: 0.8 mg/dL (ref 0.2–1.2)
Total Protein: 7.2 g/dL (ref 6.0–8.3)

## 2020-07-28 LAB — CBC
HCT: 48.9 % (ref 39.0–52.0)
Hemoglobin: 17 g/dL (ref 13.0–17.0)
MCHC: 34.8 g/dL (ref 30.0–36.0)
MCV: 88.6 fl (ref 78.0–100.0)
Platelets: 235 10*3/uL (ref 150.0–400.0)
RBC: 5.52 Mil/uL (ref 4.22–5.81)
RDW: 12.7 % (ref 11.5–15.5)
WBC: 8.2 10*3/uL (ref 4.0–10.5)

## 2020-07-28 LAB — LIPID PANEL
Cholesterol: 191 mg/dL (ref 0–200)
HDL: 26.5 mg/dL — ABNORMAL LOW (ref >39.00)
NonHDL: 164.14
Total CHOL/HDL Ratio: 7
Triglycerides: 277 mg/dL — ABNORMAL HIGH (ref 0.0–149.0)
VLDL: 55.4 mg/dL — ABNORMAL HIGH (ref 0.0–40.0)

## 2020-07-28 LAB — HEMOGLOBIN A1C: Hgb A1c MFr Bld: 6.4 % (ref 4.6–6.5)

## 2020-07-28 LAB — LDL CHOLESTEROL, DIRECT: Direct LDL: 108 mg/dL

## 2020-07-29 LAB — HEPATITIS C ANTIBODY
Hepatitis C Ab: NONREACTIVE
SIGNAL TO CUT-OFF: 0.05 (ref <1.00)

## 2020-10-14 IMAGING — DX CHEST - 2 VIEW
2 series · 2 of 2 positions shown · non-contrast
Comparison: 01/02/2012

CLINICAL DATA: Leg swelling.

EXAM:
CHEST - 2 VIEW

[chest pa]
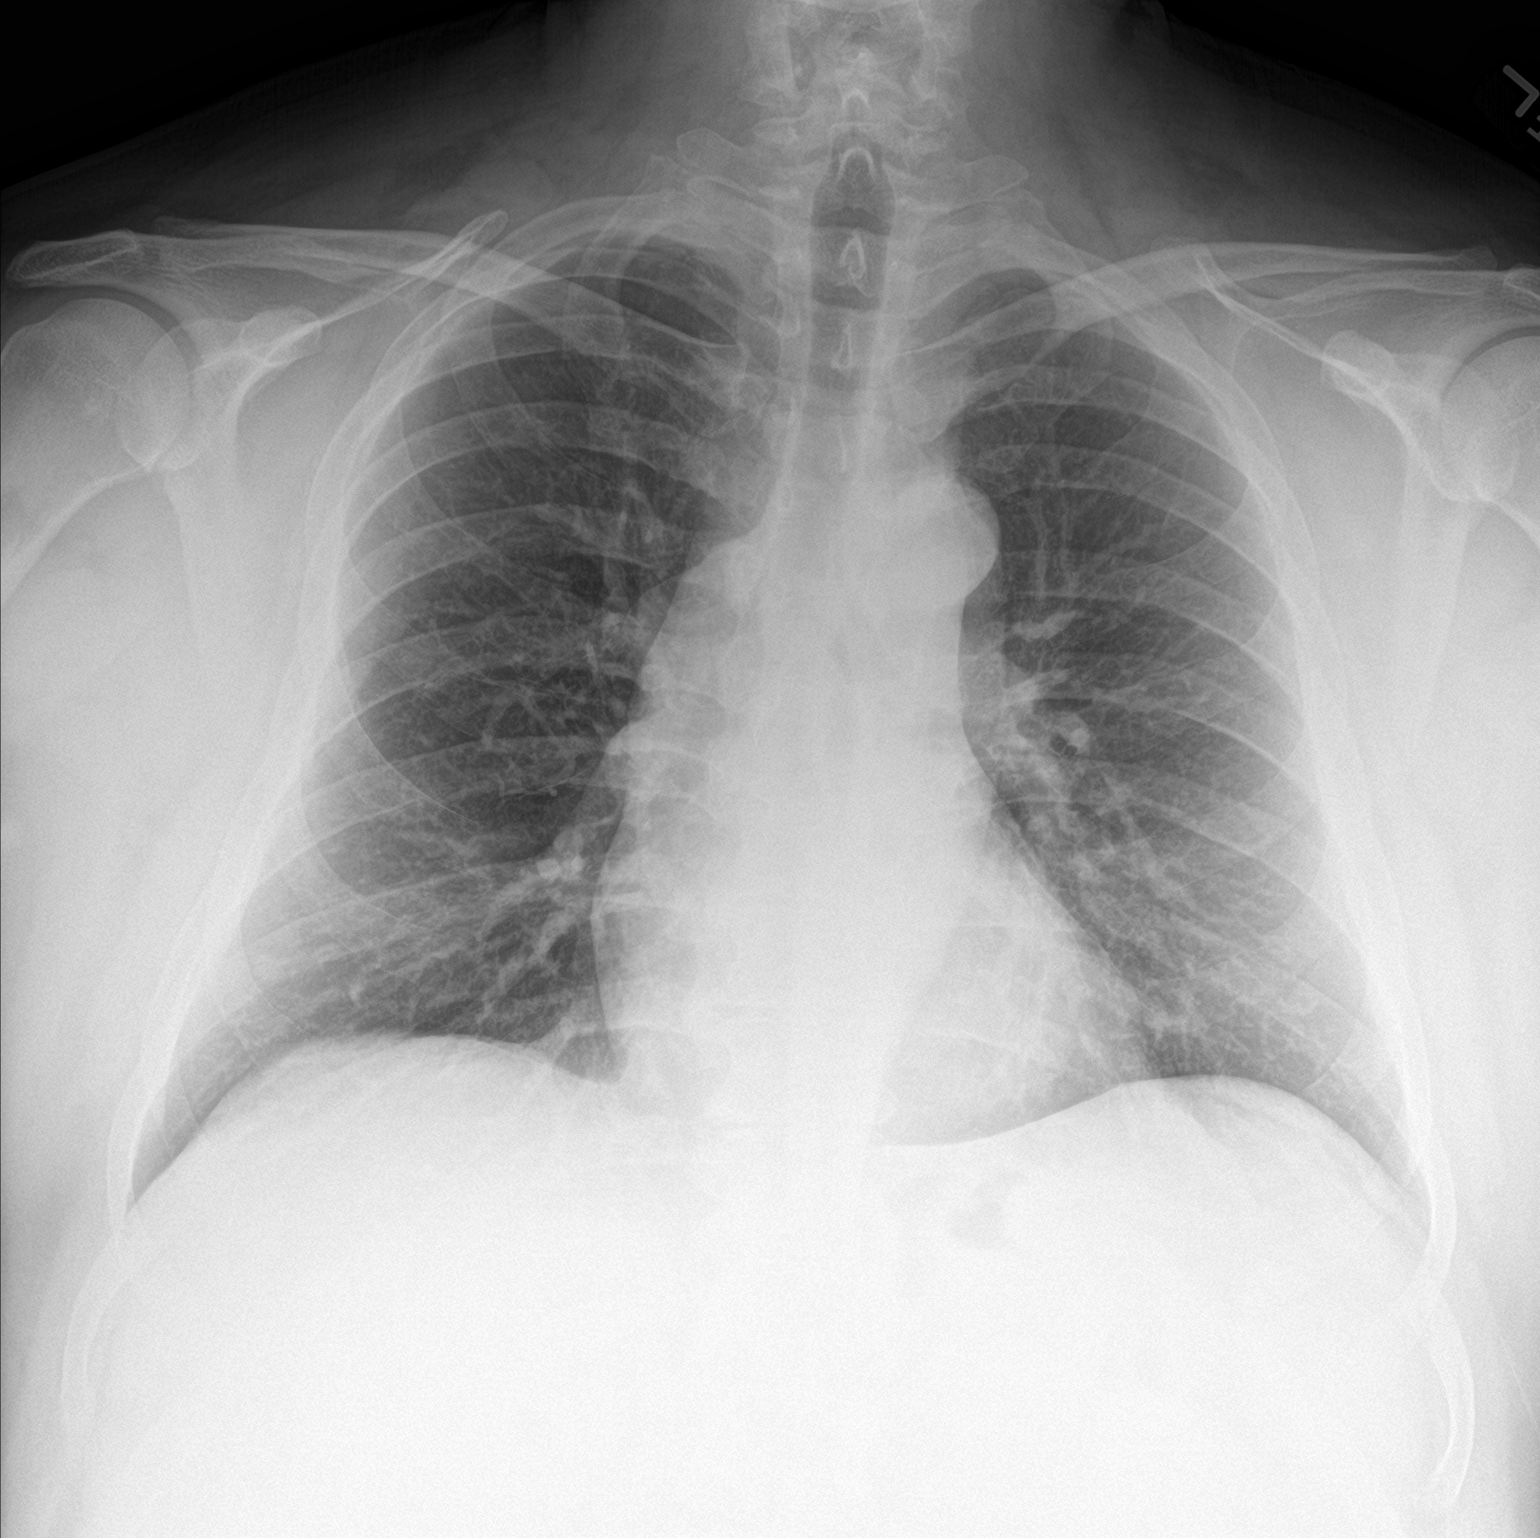

[chest lat]
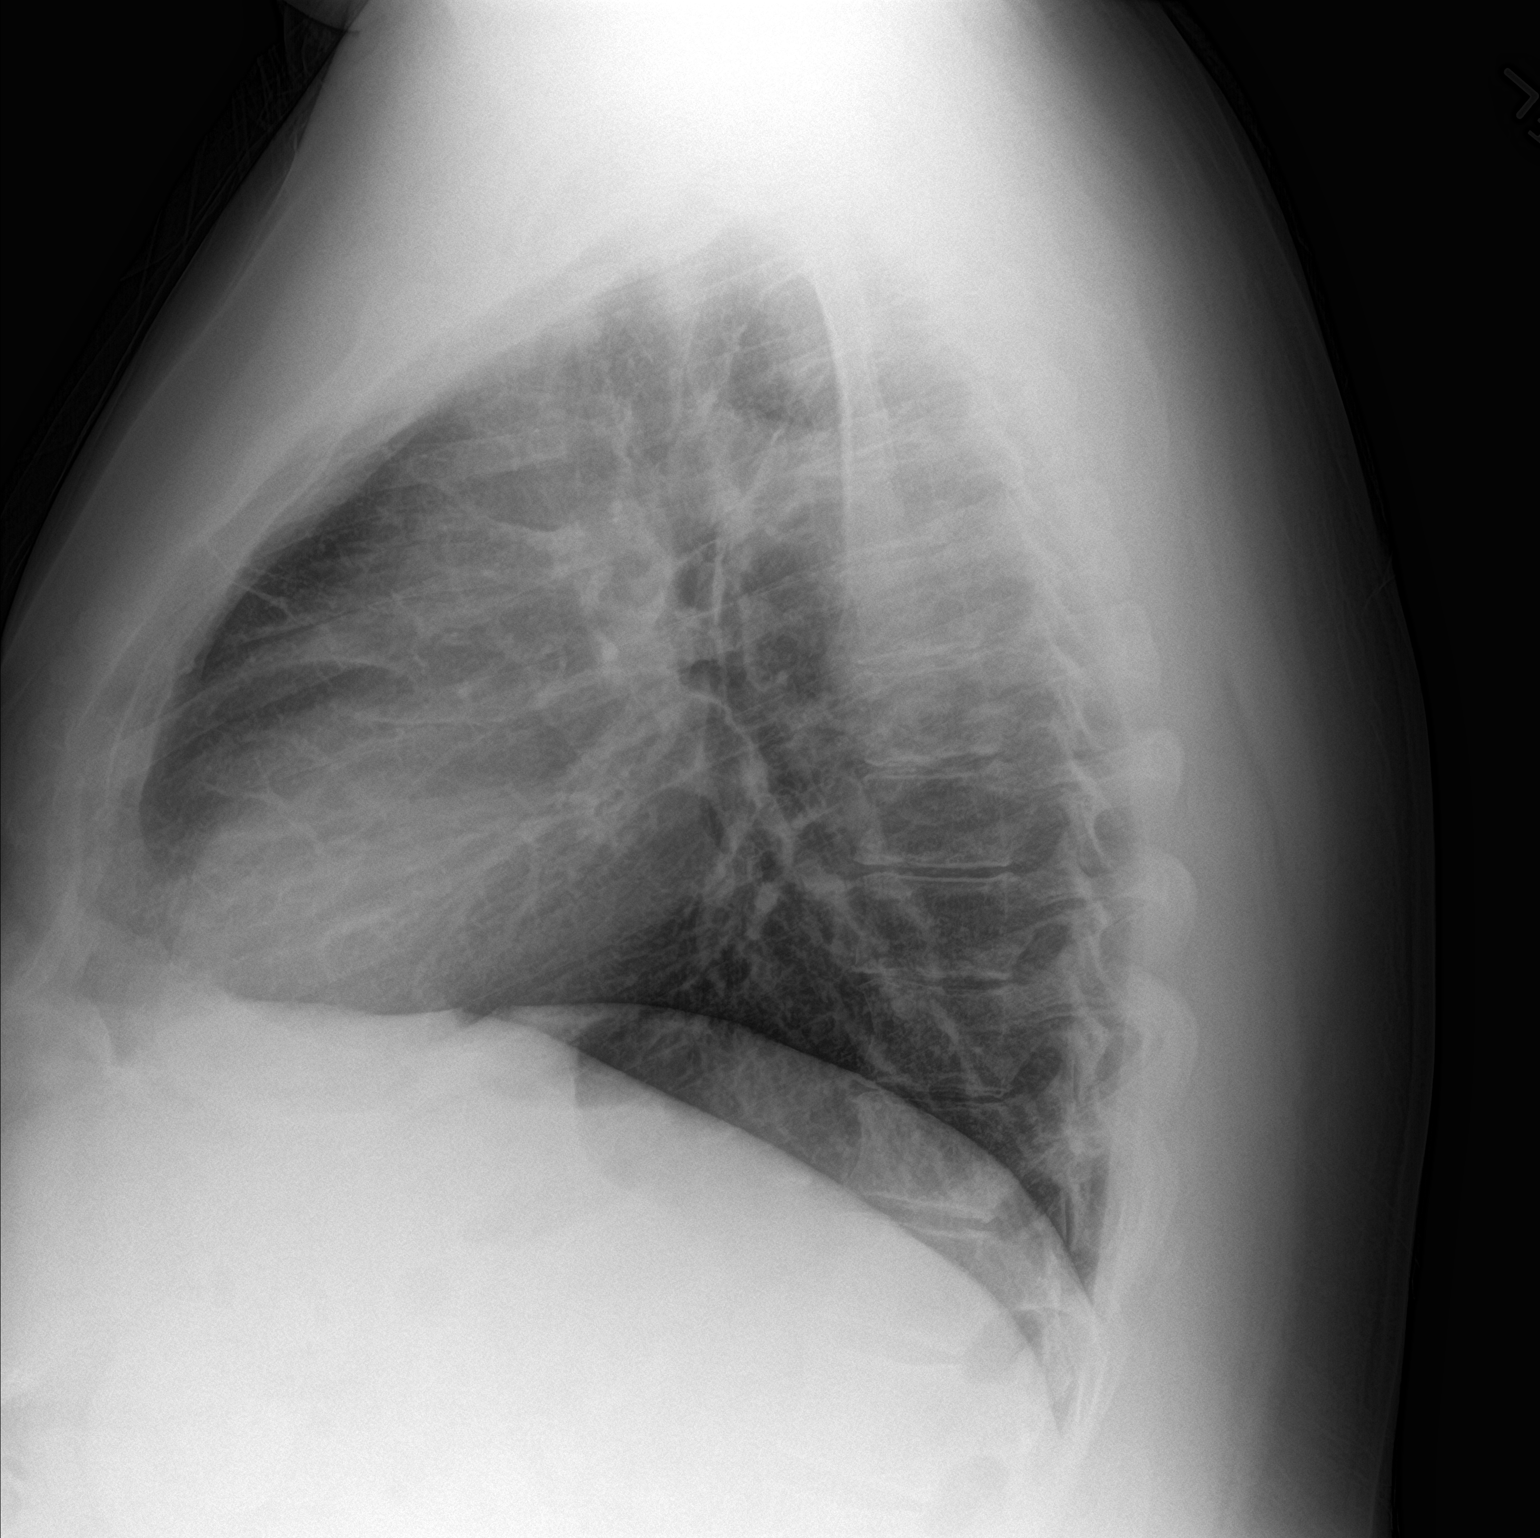

[2 of 2 positions shown; findings below may reference images not displayed]

FINDINGS: Heart size is normal. Mediastinal shadows are normal. The lungs are
clear. No bronchial thickening. No infiltrate, mass, effusion or
collapse. Pulmonary vascularity is normal. No bony abnormality.
IMPRESSION: Normal chest

## 2021-08-01 ENCOUNTER — Encounter (HOSPITAL_BASED_OUTPATIENT_CLINIC_OR_DEPARTMENT_OTHER): Payer: Self-pay | Admitting: *Deleted

## 2021-08-01 ENCOUNTER — Emergency Department (HOSPITAL_BASED_OUTPATIENT_CLINIC_OR_DEPARTMENT_OTHER)
Admission: EM | Admit: 2021-08-01 | Discharge: 2021-08-01 | Disposition: A | Discharge status: Home / Self Care | Payer: No Typology Code available for payment source | Attending: Emergency Medicine | Admitting: Emergency Medicine

## 2021-08-01 ENCOUNTER — Other Ambulatory Visit: Payer: Self-pay

## 2021-08-01 ENCOUNTER — Encounter (HOSPITAL_BASED_OUTPATIENT_CLINIC_OR_DEPARTMENT_OTHER): Payer: Self-pay | Admitting: Emergency Medicine

## 2021-08-01 ENCOUNTER — Emergency Department (HOSPITAL_BASED_OUTPATIENT_CLINIC_OR_DEPARTMENT_OTHER)
Admission: EM | Admit: 2021-08-01 | Discharge: 2021-08-02 | Disposition: A | Discharge status: Home / Self Care | Payer: No Typology Code available for payment source | Attending: Emergency Medicine | Admitting: Emergency Medicine

## 2021-08-01 ENCOUNTER — Emergency Department (HOSPITAL_BASED_OUTPATIENT_CLINIC_OR_DEPARTMENT_OTHER): Payer: No Typology Code available for payment source

## 2021-08-01 DIAGNOSIS — R109 Unspecified abdominal pain: Secondary | ICD-10-CM | POA: Diagnosis present

## 2021-08-01 DIAGNOSIS — Z79899 Other long term (current) drug therapy: Secondary | ICD-10-CM | POA: Insufficient documentation

## 2021-08-01 DIAGNOSIS — I10 Essential (primary) hypertension: Secondary | ICD-10-CM | POA: Diagnosis not present

## 2021-08-01 DIAGNOSIS — N201 Calculus of ureter: Secondary | ICD-10-CM

## 2021-08-01 DIAGNOSIS — N132 Hydronephrosis with renal and ureteral calculous obstruction: Secondary | ICD-10-CM | POA: Diagnosis not present

## 2021-08-01 LAB — CBC WITH DIFFERENTIAL/PLATELET
Abs Immature Granulocytes: 0.1 10*3/uL — ABNORMAL HIGH (ref 0.00–0.07)
Basophils Absolute: 0 10*3/uL (ref 0.0–0.1)
Basophils Relative: 0 %
Eosinophils Absolute: 0 10*3/uL (ref 0.0–0.5)
Eosinophils Relative: 0 %
HCT: 45 % (ref 39.0–52.0)
Hemoglobin: 16.5 g/dL (ref 13.0–17.0)
Immature Granulocytes: 1 %
Lymphocytes Relative: 10 %
Lymphs Abs: 1.5 10*3/uL (ref 0.7–4.0)
MCH: 31.1 pg (ref 26.0–34.0)
MCHC: 36.7 g/dL — ABNORMAL HIGH (ref 30.0–36.0)
MCV: 84.9 fL (ref 80.0–100.0)
Monocytes Absolute: 0.4 10*3/uL (ref 0.1–1.0)
Monocytes Relative: 3 %
Neutro Abs: 13.9 10*3/uL — ABNORMAL HIGH (ref 1.7–7.7)
Neutrophils Relative %: 86 %
Platelets: 207 10*3/uL (ref 150–400)
RBC: 5.3 MIL/uL (ref 4.22–5.81)
RDW: 12.1 % (ref 11.5–15.5)
WBC: 16 10*3/uL — ABNORMAL HIGH (ref 4.0–10.5)
nRBC: 0 % (ref 0.0–0.2)

## 2021-08-01 LAB — URINALYSIS, ROUTINE W REFLEX MICROSCOPIC
Bilirubin Urine: NEGATIVE
Glucose, UA: 100 mg/dL — AB
Ketones, ur: NEGATIVE mg/dL
Leukocytes,Ua: NEGATIVE
Nitrite: NEGATIVE
Protein, ur: NEGATIVE mg/dL
Specific Gravity, Urine: 1.02 (ref 1.005–1.030)
pH: 7 (ref 5.0–8.0)

## 2021-08-01 LAB — URINALYSIS, MICROSCOPIC (REFLEX): RBC / HPF: >50 RBC/hpf (ref 0–5)

## 2021-08-01 LAB — BASIC METABOLIC PANEL
Anion gap: 14 (ref 5–15)
BUN: 13 mg/dL (ref 6–20)
CO2: 21 mmol/L — ABNORMAL LOW (ref 22–32)
Calcium: 8.9 mg/dL (ref 8.9–10.3)
Chloride: 100 mmol/L (ref 98–111)
Creatinine, Ser: 1.06 mg/dL (ref 0.61–1.24)
GFR, Estimated: >60 mL/min (ref >60)
Glucose, Bld: 248 mg/dL — ABNORMAL HIGH (ref 70–99)
Potassium: 3.6 mmol/L (ref 3.5–5.1)
Sodium: 135 mmol/L (ref 135–145)

## 2021-08-01 MED ORDER — HYDROMORPHONE HCL 2 MG PO TABS: 2.0000 mg | ORAL_TABLET | ORAL | 0 refills | Status: AC | PRN

## 2021-08-01 MED ORDER — NAPROXEN 375 MG PO TABS: ORAL_TABLET | ORAL | 1 refills | Status: AC

## 2021-08-01 MED ORDER — HYDROMORPHONE HCL 1 MG/ML IJ SOLN: 1.0000 mg | Freq: Once | INTRAMUSCULAR | Status: DC

## 2021-08-01 MED ORDER — TAMSULOSIN HCL 0.4 MG PO CAPS: ORAL_CAPSULE | ORAL | 0 refills | Status: AC

## 2021-08-01 MED ORDER — ONDANSETRON HCL 4 MG/2ML IJ SOLN: 4.0000 mg | Freq: Once | INTRAMUSCULAR | Status: DC

## 2021-08-01 MED ORDER — KETOROLAC TROMETHAMINE 15 MG/ML IJ SOLN
15.0000 mg | Freq: Once | INTRAMUSCULAR | Status: AC
  Administered 2021-08-01: 15 mg via INTRAVENOUS
  Filled 2021-08-01: qty 1

## 2021-08-01 MED ORDER — ONDANSETRON 8 MG PO TBDP: 8.0000 mg | ORAL_TABLET | Freq: Three times a day (TID) | ORAL | 1 refills | Status: AC | PRN

## 2021-08-01 MED ORDER — KETOROLAC TROMETHAMINE 30 MG/ML IJ SOLN
30.0000 mg | Freq: Once | INTRAMUSCULAR | Status: AC
  Administered 2021-08-01: 30 mg via INTRAVENOUS
  Filled 2021-08-01: qty 1

## 2021-08-01 MED ORDER — TAMSULOSIN HCL 0.4 MG PO CAPS
0.4000 mg | ORAL_CAPSULE | Freq: Once | ORAL | Status: AC
  Administered 2021-08-01: 0.4 mg via ORAL
  Filled 2021-08-01: qty 1

## 2021-08-01 MED ORDER — HYDROMORPHONE HCL 1 MG/ML IJ SOLN
1.0000 mg | Freq: Once | INTRAMUSCULAR | Status: AC
  Administered 2021-08-01: 1 mg via INTRAVENOUS
  Filled 2021-08-01: qty 1

## 2021-08-01 NOTE — ED Provider Notes (Signed)
[facility] EMERGENCY DEPARTMENT Provider Note   CSN: [id number] Arrival date & time: 08/01/21  2105     History  Chief Complaint  Patient presents with   Flank Pain    Brian Rhodes is a 46 y.o. male.  46 year old male who presents emergency department today secondary to recurrent left flank pain associated with a distal 3 mm stone.  Patient was hesitant to get any pain medication at home however once his pain got severe again this afternoon he did try the Dilaudid.  No fevers or urinary changes otherwise.  Has had some nausea but no emesis.  No other new symptoms.  Has not passed the stone that he knows of.   Flank Pain      Home Medications Prior to Admission medications   Medication Sig Start Date End Date Taking? Authorizing Provider  ketorolac (TORADOL) 10 MG tablet Take 1 tablet (10 mg total) by mouth every 6 (six) hours as needed for severe pain. 08/02/21  Yes Mia Winthrop, Barbara Cower, MD  allopurinol (ZYLOPRIM) 100 MG tablet Take 100 mg by mouth daily.    [provider]  BYSTOLIC 20 MG TABS Take 1 tablet by mouth daily. 06/28/20   [provider]  HYDROmorphone (DILAUDID) 2 MG tablet Take 1 tablet (2 mg total) by mouth every 4 (four) hours as needed for severe pain. 08/01/21   Molpus, John, MD  losartan (COZAAR) 100 MG tablet Take 100 mg by mouth daily.    [provider]  montelukast (SINGULAIR) 10 MG tablet Take 10 mg by mouth at bedtime.    [provider]  naproxen (NAPROSYN) 375 MG tablet Take 1 tablet twice daily for flank pain until stone passes. 08/01/21   Molpus, John, MD  ondansetron (ZOFRAN-ODT) 8 MG disintegrating tablet Take 1 tablet (8 mg total) by mouth every 8 (eight) hours as needed. 8mg  ODT q4 hours prn nausea 08/01/21   Molpus, John, MD  potassium chloride SA (KLOR-CON M20) 20 MEQ tablet Take 1 tablet (20 mEq total) by mouth daily. 07/23/19 10/21/19  Copland, 10/23/19, MD  tamsulosin (FLOMAX) 0.4 MG CAPS capsule Take 1 capsule  daily until stone passes. 08/01/21   Molpus, 09/29/21, MD      Allergies    Patient has no known allergies.    Review of Systems   Review of Systems  Genitourinary:  Positive for flank pain.   Physical Exam Updated Vital Signs BP (!) 141/92 (BP Location: Right Arm)    Pulse 71    Temp 98 F (36.7 C) (Oral)    Resp 18    Ht 5\' 11"  (1.803 m)    Wt 128.4 kg    SpO2 93%    BMI 39.48 kg/m  Physical Exam Vitals and nursing note reviewed.  Constitutional:      Appearance: He is well-developed.  HENT:     Head: Normocephalic and atraumatic.     Nose: Nose normal. No congestion or rhinorrhea.  Eyes:     Pupils: Pupils are equal, round, and reactive to light.  Cardiovascular:     Rate and Rhythm: Normal rate.  Pulmonary:     Effort: Pulmonary effort is normal. No respiratory distress.  Abdominal:     General: Abdomen is flat. There is no distension.  Musculoskeletal:        General: No swelling or tenderness. Normal range of motion.     Cervical back: Normal range of motion.  Skin:    General: Skin is warm and  dry.     Coloration: Skin is not jaundiced or pale.  Neurological:     General: No focal deficit present.     Mental Status: He is alert.    ED Results / Procedures / Treatments   Labs (all labs ordered are listed, but only abnormal results are displayed) Labs Reviewed  CBC WITH DIFFERENTIAL/PLATELET - Abnormal; Notable for the following components:      Result Value   WBC 16.0 (*)    MCHC 36.7 (*)    Neutro Abs 13.9 (*)    Abs Immature Granulocytes 0.10 (*)    All other components within normal limits  BASIC METABOLIC PANEL - Abnormal; Notable for the following components:   CO2 21 (*)    Glucose, Bld 248 (*)    All other components within normal limits    EKG None  Radiology CT Renal Stone Study  Result Date: 08/01/2021 CLINICAL DATA:  Left flank pain, urinary hesitancy EXAM: CT ABDOMEN AND PELVIS WITHOUT CONTRAST TECHNIQUE: Multidetector CT imaging of the  abdomen and pelvis was performed following the standard protocol without IV contrast. RADIATION DOSE REDUCTION: This exam was performed according to the departmental dose-optimization program which includes automated exposure control, adjustment of the mA and/or kV according to patient size and/or use of iterative reconstruction technique. COMPARISON:  None. FINDINGS: Lower chest: The visualized lung bases are clear. Mild coronary artery calcification. Global cardiac size within normal limits. Hepatobiliary: Mild hepatomegaly. Scattered subserosal calcifications within the liver may reflect the sequela of remote trauma or inflammation. The liver is otherwise unremarkable on this noncontrast examination. Gallbladder unremarkable. No intra or extrahepatic biliary ductal dilation. Pancreas: Unremarkable Spleen: Unremarkable Adrenals/Urinary Tract: The adrenal glands are unremarkable. The kidneys are normal in size and position. 3 mm obstructing calculus within the distal left ureter 1-2 cm proximal to the left ureterovesicular junction results in mild left hydronephrosis. No additional nephro or urolithiasis identified. No hydronephrosis on the right. The bladder is unremarkable. Stomach/Bowel: Mild descending and sigmoid colonic diverticulosis without superimposed acute inflammatory change. The stomach, small bowel, and large bowel are otherwise unremarkable. Appendix normal. No free intraperitoneal gas or fluid. Vascular/Lymphatic: Aortic atherosclerosis. No enlarged abdominal or pelvic lymph nodes. Reproductive: Prostate is unremarkable. Other: No abdominal wall hernia. Musculoskeletal: No acute bone abnormality. No lytic or blastic bone lesion. IMPRESSION: Obstructing 3 mm calculus within the distal left ureter resulting in mild left hydronephrosis. Mild coronary artery calcification. Mild hepatomegaly. Mild distal colonic diverticulosis without superimposed acute inflammatory change. Aortic Atherosclerosis  (ICD10-I70.0). Electronically Signed   By: Helyn Numbers M.D.   On: 08/01/2021 02:38    Procedures Procedures    Medications Ordered in ED Medications  oxyCODONE-acetaminophen (PERCOCET/ROXICET) 5-325 MG per tablet 2 tablet (2 tablets Oral Not Given 08/02/21 0138)  HYDROmorphone (DILAUDID) injection 1 mg (1 mg Intravenous Given 08/01/21 2307)  ketorolac (TORADOL) 30 MG/ML injection 30 mg (30 mg Intravenous Given 08/01/21 2301)  tamsulosin (FLOMAX) capsule 0.4 mg (0.4 mg Oral Given 08/01/21 2316)    ED Course/ Medical Decision Making/ A&P                           Medical Decision Making Amount and/or Complexity of Data Reviewed Labs: ordered.  Risk Prescription drug management.   Patient with recurrent symptoms secondary to his kidney stone I feel he was not really very aggressive in his pain regimen so we will redose.  I will to check his kidney function  to make sure is no significant changes there is any reason to repeat any imaging at this time.  We will just work on symptom control.  Patient was observed for a few hours and ultimately had complete resolution of his symptoms for >1 hour. Stone may be in bladder? Either way, BMP reassuring and feels better so will dc/ to continue home meds. Will try PO toradol instead of naproxen.    Final Clinical Impression(s) / ED Diagnoses Final diagnoses:  Ureterolithiasis    Rx / DC Orders ED Discharge Orders          Ordered    ketorolac (TORADOL) 10 MG tablet  Every 6 hours PRN        08/02/21 0211              Noach Calvillo, Barbara Cower, MD 08/02/21 [phone]

## 2021-08-01 NOTE — ED Triage Notes (Signed)
Reports he woke up tonight with left flank pain.  Endorses feeling like he needs to urinate but can't do much.  Also reports trying to have a bm but unable.

## 2021-08-01 NOTE — ED Notes (Signed)
Patient did not want to have IV placed at this time.  Reports that he "doesn't like pain medications" and would like "Motrin or something."

## 2021-08-01 NOTE — ED Provider Notes (Signed)
[name] [facility] Provider Note: Brian Spurling, MD, FACEP  CSN: [id number] MRN: [id number] ARRIVAL: 08/01/21 at [place]  Flank Pain   HISTORY OF PRESENT ILLNESS  08/01/21 2:10 AM Brian Rhodes is a 46 y.o. male who awakened about midnight with left flank pain.  He rates the pain as a 9 out of 10 and describes it as dull and shooting.  It does not worse with movement or palpation.  He feels the need to move his bowels but cannot.  He denies gross hematuria or difficulty urinating.  He has not had a fever with this.   Past Medical History:  Diagnosis Date   Allergy    Chicken pox    GERD (gastroesophageal reflux disease)    Gout    Hyperlipidemia    Hypertension     Past Surgical History:  Procedure Laterality Date   WISDOM TOOTH EXTRACTION  2015    Family History  Problem Relation Age of Onset   Diabetes Mother    Hyperlipidemia Mother    Hypertension Mother    Miscarriages / [place] Mother    Arthritis Father    Diabetes Father    Hyperlipidemia Father    Hypertension Father    Hypertension Sister    Hypertension Brother    Hypertension Brother    Hypertension Brother     Social History   Tobacco Use   Smoking status: Never   Smokeless tobacco: Never  Vaping Use   Vaping Use: Never used  Substance Use Topics   Alcohol use: Yes    Comment: occasionally   Drug use: No    Prior to Admission medications   Medication Sig Start Date End Date Taking? Authorizing Provider  HYDROmorphone (DILAUDID) 2 MG tablet Take 1 tablet (2 mg total) by mouth every 4 (four) hours as needed for severe pain. 08/01/21  Yes Xavier Munger, MD  naproxen (NAPROSYN) 375 MG tablet Take 1 tablet twice daily for flank pain until stone passes. 08/01/21  Yes Rhyatt Muska, MD  ondansetron (ZOFRAN-ODT) 8 MG disintegrating tablet Take 1 tablet (8 mg total) by mouth every 8 (eight) hours as needed. 8mg  ODT q4 hours prn nausea 08/01/21  Yes Arabela Basaldua, MD   tamsulosin (FLOMAX) 0.4 MG CAPS capsule Take 1 capsule daily until stone passes. 08/01/21  Yes Doaa Kendzierski, MD  allopurinol (ZYLOPRIM) 100 MG tablet Take 100 mg by mouth daily.    [provider]  BYSTOLIC 20 MG TABS Take 1 tablet by mouth daily. 06/28/20   [provider]  losartan (COZAAR) 100 MG tablet Take 100 mg by mouth daily.    [provider]  montelukast (SINGULAIR) 10 MG tablet Take 10 mg by mouth at bedtime.    [provider]  potassium chloride SA (KLOR-CON M20) 20 MEQ tablet Take 1 tablet (20 mEq total) by mouth daily. 07/23/19 10/21/19  Copland, Gay Filler, MD    Allergies Patient has no known allergies.   REVIEW OF SYSTEMS  Negative except as noted here or in the History of Present Illness.   PHYSICAL EXAMINATION  Initial Vital Signs Blood pressure (!) 195/122, pulse 66, temperature 97.7 F (36.5 C), temperature source Oral, resp. rate 18, height 5\' 11"  (1.803 m), weight 128.4 kg, SpO2 97 %.  Examination General: Well-developed, well-nourished male in no acute distress; appearance consistent with age of record HENT: normocephalic; atraumatic Eyes: Normal appearance Neck: Supple Heart: regular rate and rhythm Lungs: clear to auscultation bilaterally  Abdomen: soft; nondistended; nontender; bowel sounds present GU: No CVA tenderness Extremities: No deformity; full range of motion; pulses normal Neurologic: Awake, alert and oriented; motor function intact in all extremities and symmetric; no facial droop Skin: Warm and dry Psychiatric: Normal mood and affect   RESULTS  Summary of this visit's results, reviewed and interpreted by myself:   EKG Interpretation  Date/Time:    Ventricular Rate:    PR Interval:    QRS Duration:   QT Interval:    QTC Calculation:   R Axis:     Text Interpretation:         Laboratory Studies: Results for orders placed or performed during the hospital encounter of 08/01/21 (from the past 24  hour(s))  Urinalysis, Routine w reflex microscopic Urine, Clean Catch     Status: Abnormal   Collection Time: 08/01/21  2:16 AM  Result Value Ref Range   Color, Urine YELLOW YELLOW   APPearance CLEAR CLEAR   Specific Gravity, Urine 1.020 1.005 - 1.030   pH 7.0 5.0 - 8.0   Glucose, UA 100 (A) NEGATIVE mg/dL   Hgb urine dipstick LARGE (A) NEGATIVE   Bilirubin Urine NEGATIVE NEGATIVE   Ketones, ur NEGATIVE NEGATIVE mg/dL   Protein, ur NEGATIVE NEGATIVE mg/dL   Nitrite NEGATIVE NEGATIVE   Leukocytes,Ua NEGATIVE NEGATIVE  Urinalysis, Microscopic (reflex)     Status: Abnormal   Collection Time: 08/01/21  2:16 AM  Result Value Ref Range   RBC / HPF >50 0 - 5 RBC/hpf   WBC, UA 0-5 0 - 5 WBC/hpf   Bacteria, UA FEW (A) NONE SEEN   Squamous Epithelial / LPF 0-5 0 - 5   Imaging Studies: CT Renal Stone Study  Result Date: 08/01/2021 CLINICAL DATA:  Left flank pain, urinary hesitancy EXAM: CT ABDOMEN AND PELVIS WITHOUT CONTRAST TECHNIQUE: Multidetector CT imaging of the abdomen and pelvis was performed following the standard protocol without IV contrast. RADIATION DOSE REDUCTION: This exam was performed according to the departmental dose-optimization program which includes automated exposure control, adjustment of the mA and/or kV according to patient size and/or use of iterative reconstruction technique. COMPARISON:  None. FINDINGS: Lower chest: The visualized lung bases are clear. Mild coronary artery calcification. Global cardiac size within normal limits. Hepatobiliary: Mild hepatomegaly. Scattered subserosal calcifications within the liver may reflect the sequela of remote trauma or inflammation. The liver is otherwise unremarkable on this noncontrast examination. Gallbladder unremarkable. No intra or extrahepatic biliary ductal dilation. Pancreas: Unremarkable Spleen: Unremarkable Adrenals/Urinary Tract: The adrenal glands are unremarkable. The kidneys are normal in size and position. 3 mm  obstructing calculus within the distal left ureter 1-2 cm proximal to the left ureterovesicular junction results in mild left hydronephrosis. No additional nephro or urolithiasis identified. No hydronephrosis on the right. The bladder is unremarkable. Stomach/Bowel: Mild descending and sigmoid colonic diverticulosis without superimposed acute inflammatory change. The stomach, small bowel, and large bowel are otherwise unremarkable. Appendix normal. No free intraperitoneal gas or fluid. Vascular/Lymphatic: Aortic atherosclerosis. No enlarged abdominal or pelvic lymph nodes. Reproductive: Prostate is unremarkable. Other: No abdominal wall hernia. Musculoskeletal: No acute bone abnormality. No lytic or blastic bone lesion. IMPRESSION: Obstructing 3 mm calculus within the distal left ureter resulting in mild left hydronephrosis. Mild coronary artery calcification. Mild hepatomegaly. Mild distal colonic diverticulosis without superimposed acute inflammatory change. Aortic Atherosclerosis (ICD10-I70.0). Electronically Signed   By: Fidela Salisbury M.D.   On: 08/01/2021 02:38    ED COURSE and MDM  Nursing notes, initial  and subsequent vitals signs, including pulse oximetry, reviewed and interpreted by myself.  Vitals:   08/01/21 0203 08/01/21 0205  BP:  (!) 195/122  Pulse:  66  Resp:  18  Temp:  97.7 F (36.5 C)  TempSrc:  Oral  SpO2:  97%  Weight: 128.4 kg   Height: 5\' 11"  (1.803 m)    Medications  ondansetron (ZOFRAN) injection 4 mg (4 mg Intravenous Not Given 08/01/21 0227)  HYDROmorphone (DILAUDID) injection 1 mg (1 mg Intravenous Patient Refused/Not Given 08/01/21 0227)  ketorolac (TORADOL) 15 MG/ML injection 15 mg (0 mg Intravenous Hold 08/01/21 0250)  tamsulosin (FLOMAX) capsule 0.4 mg (has no administration in time range)   3:05 AM Patient has been stoic and declined pain medications up to this point but has agreed now to take Toradol.  He was advised of CT findings confirming clinical presentation  of ureteral colic.  We will treat with outpatient medications and refer to urology if the stone does not pass on its own.   PROCEDURES  Procedures   ED DIAGNOSES     ICD-10-CM   1. Ureterolithiasis  N20.1          Tajon Moring, MD 08/01/21 [phone]

## 2021-08-01 NOTE — ED Triage Notes (Signed)
Left flank pain. He was here last night with a kidney stone. He has taken all the medications given to him yesterday. Bowel movements have been fine today. He has not passed the stone.

## 2021-08-02 MED ORDER — KETOROLAC TROMETHAMINE 10 MG PO TABS: 10.0000 mg | ORAL_TABLET | Freq: Four times a day (QID) | ORAL | 0 refills | Status: AC | PRN

## 2021-08-02 MED ORDER — OXYCODONE-ACETAMINOPHEN 5-325 MG PO TABS: 2.0000 | ORAL_TABLET | Freq: Once | ORAL | Status: DC

## 2023-05-24 IMAGING — CT CT RENAL STONE PROTOCOL
2 of 3 series · 16 of 46 positions shown, 18 images · non-contrast
Comparison: None.

CLINICAL DATA: Left flank pain, urinary hesitancy



[Series 2: axial st · axial · 0.98mm/px · z∈[-759,-309]mm · 13 of 104 slices shown, 15 images]
[im 7/104  soft-tissue]
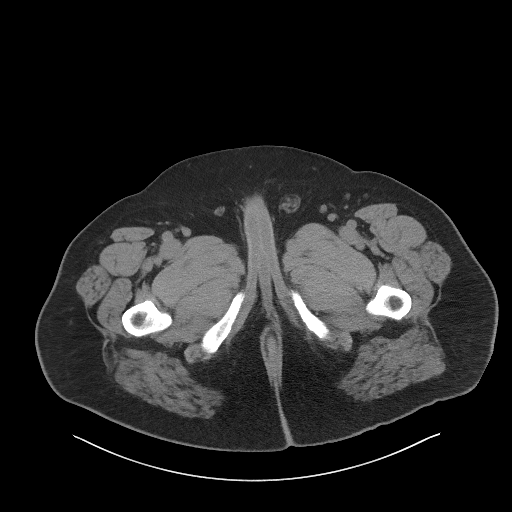
[im 7/104  bone]
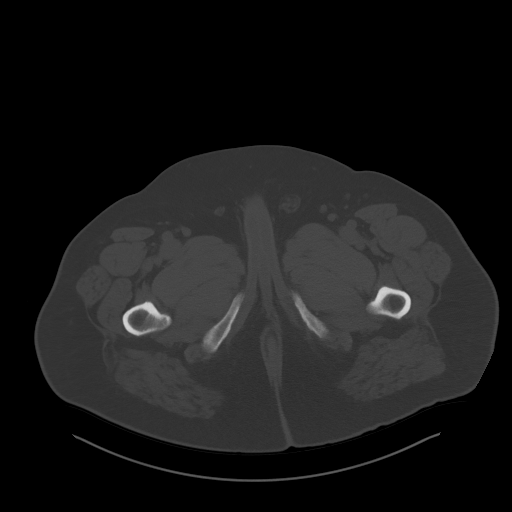
[im 14/104  soft-tissue]
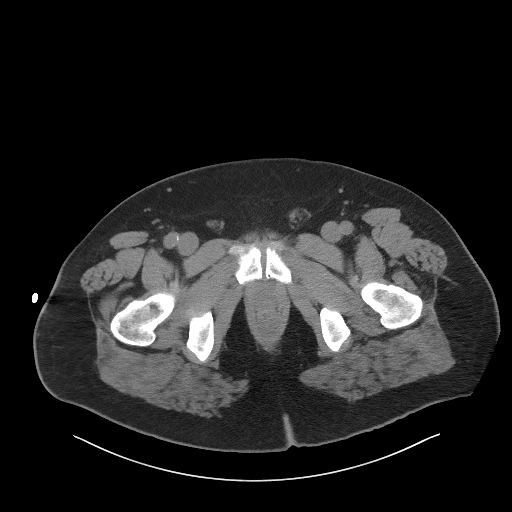
[im 20/104  soft-tissue]
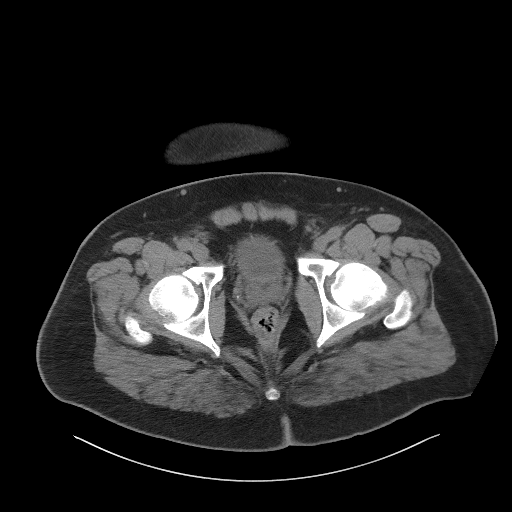
[im 30/104  soft-tissue]
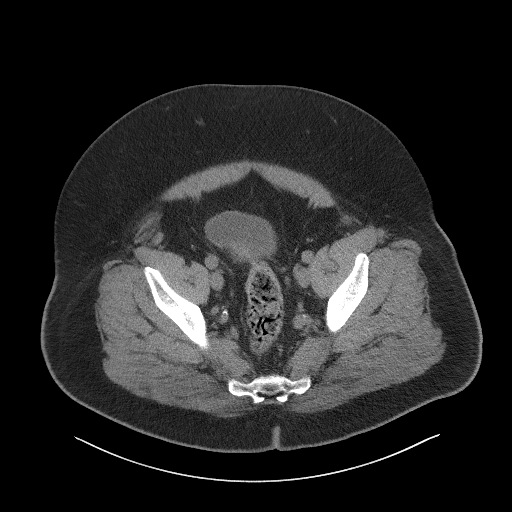
[im 37/104  soft-tissue]
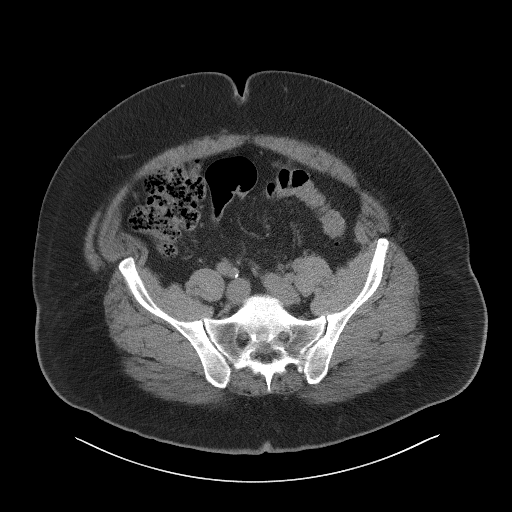
[im 44/104  soft-tissue]
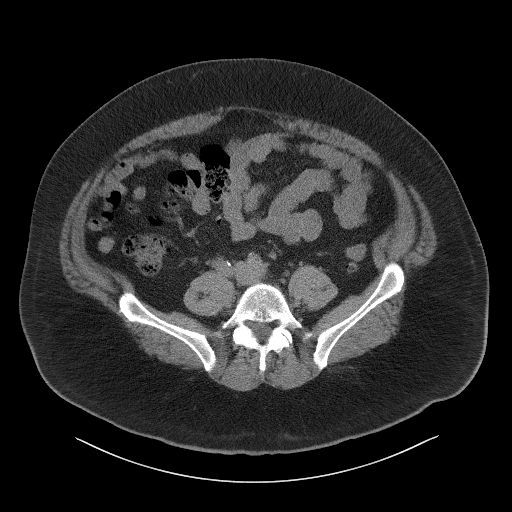
[im 54/104  soft-tissue]
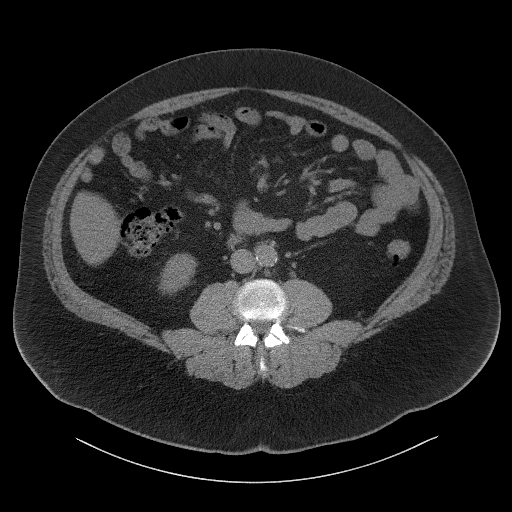
[im 60/104  soft-tissue]
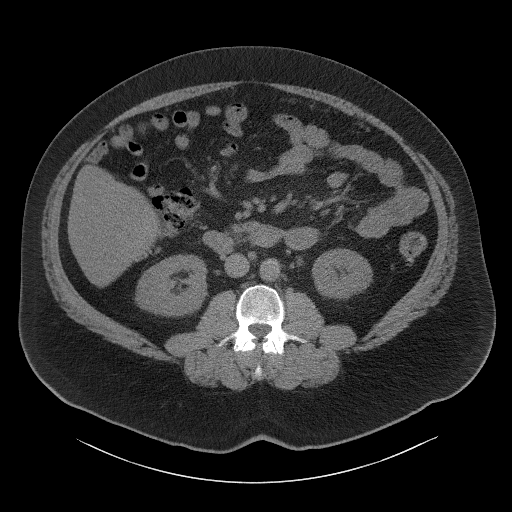
[im 67/104  soft-tissue]
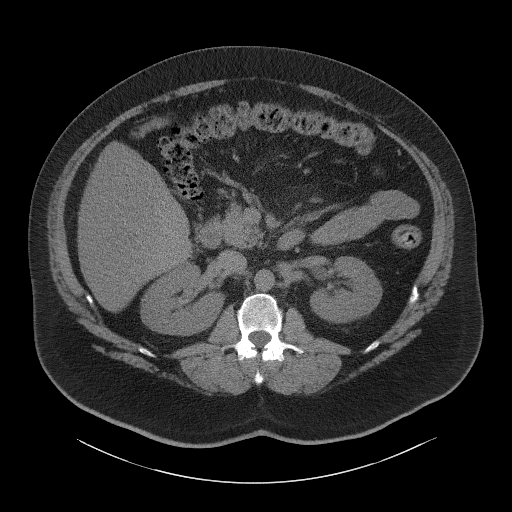
[im 67/104  bone]
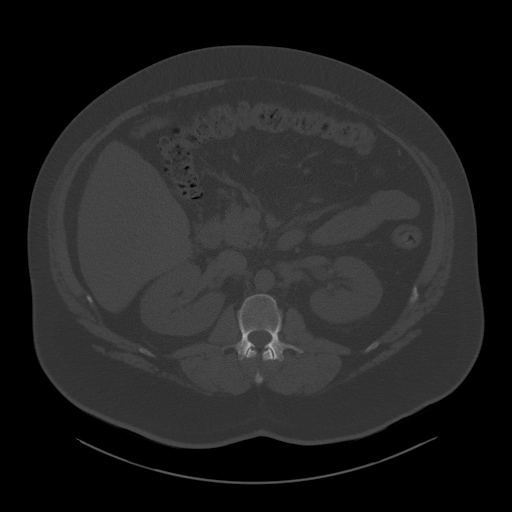
[im 74/104  soft-tissue]
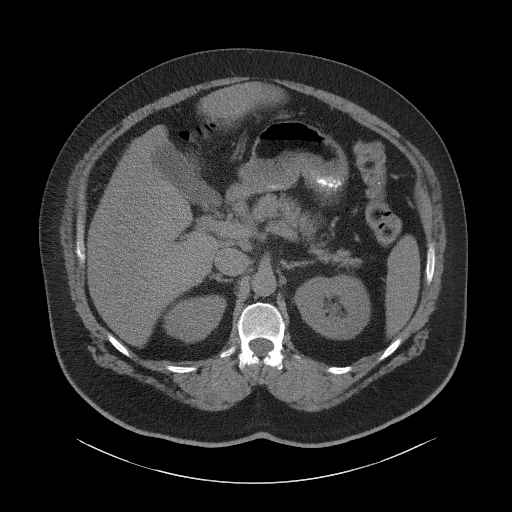
[im 84/104  soft-tissue]
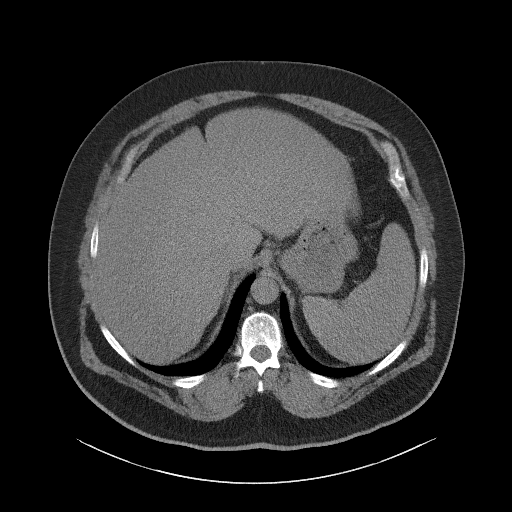
[im 90/104  soft-tissue]
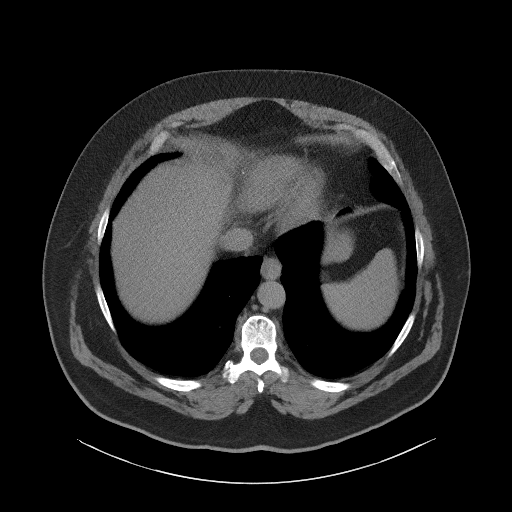
[im 97/104  soft-tissue]
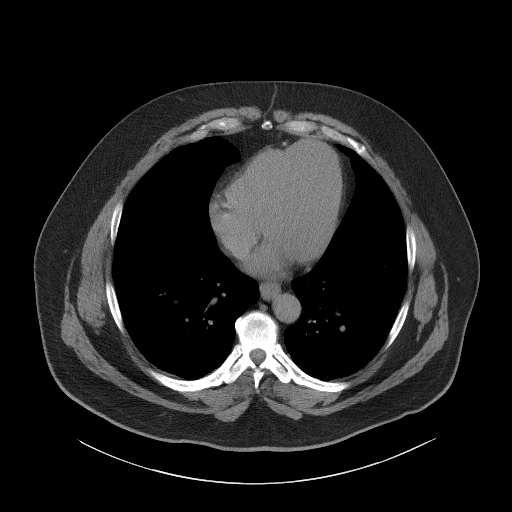

[Series 4: coronal st · coronal · 0.95mm/px · 3 of 147 slices shown]
[im 49/147  soft-tissue]
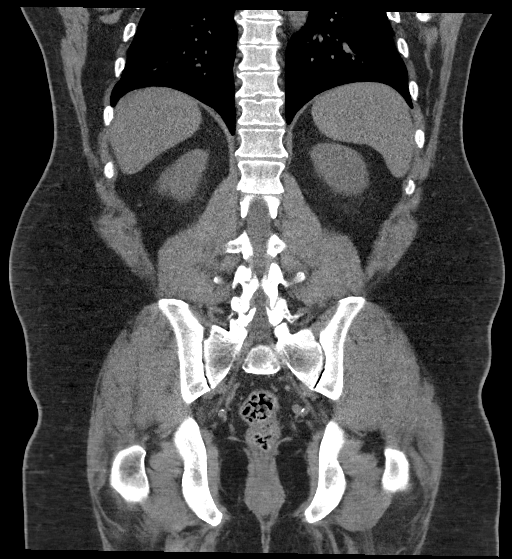
[im 65/147  soft-tissue]
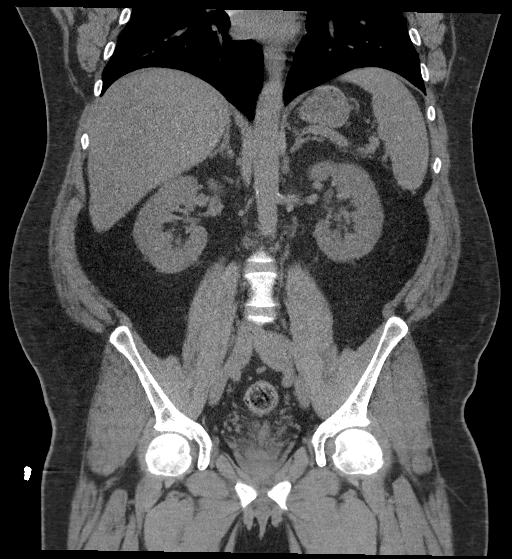
[im 82/147  soft-tissue]
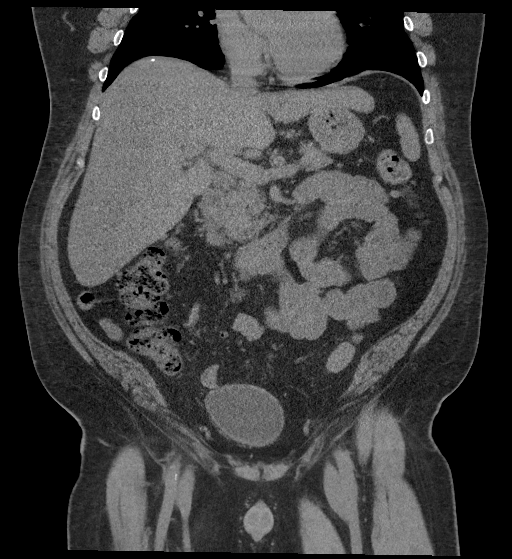

[16 of 46 positions shown; findings below may reference images not displayed]

FINDINGS: Lower chest: The visualized lung bases are clear. Mild coronary
artery calcification. Global cardiac size within normal limits.

Hepatobiliary: Mild hepatomegaly. Scattered subserosal
calcifications within the liver may reflect the sequela of remote
trauma or inflammation. The liver is otherwise unremarkable on this
noncontrast examination. Gallbladder unremarkable. No intra or
extrahepatic biliary ductal dilation.

Pancreas: Unremarkable

Spleen: Unremarkable

Adrenals/Urinary Tract: The adrenal glands are unremarkable. The
kidneys are normal in size and position. 3 mm obstructing calculus
within the distal left ureter 1-2 cm proximal to the left
ureterovesicular junction results in mild left hydronephrosis. No
additional nephro or urolithiasis identified. No hydronephrosis on
the right. The bladder is unremarkable.

Stomach/Bowel: Mild descending and sigmoid colonic diverticulosis
without superimposed acute inflammatory change. The stomach, small
bowel, and large bowel are otherwise unremarkable. Appendix normal.
No free intraperitoneal gas or fluid.

Vascular/Lymphatic: Aortic atherosclerosis. No enlarged abdominal or
pelvic lymph nodes.

Reproductive: Prostate is unremarkable.

Other: No abdominal wall hernia.

Musculoskeletal: No acute bone abnormality. No lytic or blastic bone
lesion.
IMPRESSION: Obstructing 3 mm calculus within the distal left ureter resulting in
mild left hydronephrosis.

Mild coronary artery calcification.

Mild hepatomegaly.

Mild distal colonic diverticulosis without superimposed acute
inflammatory change.

Aortic Atherosclerosis ([id number]).

## 2023-06-25 ENCOUNTER — Telehealth: Payer: Self-pay | Admitting: Family Medicine

## 2023-06-25 NOTE — Telephone Encounter (Signed)
LVM advising patient that he is due for his yearly physical. Requested call back to schedule.
